# Patient Record
Sex: Female | Born: 1975 | Race: White | Hispanic: No | Marital: Married | Smoking: Current every day smoker
Health system: Southern US, Community
[De-identification: ages and names within clinical notes are randomized; demographics above are authoritative.]

## PROBLEM LIST (undated history)

## (undated) DIAGNOSIS — E079 Disorder of thyroid, unspecified: Secondary | ICD-10-CM

## (undated) DIAGNOSIS — F32A Depression, unspecified: Secondary | ICD-10-CM

## (undated) DIAGNOSIS — F1921 Other psychoactive substance dependence, in remission: Secondary | ICD-10-CM

## (undated) DIAGNOSIS — E785 Hyperlipidemia, unspecified: Secondary | ICD-10-CM

## (undated) DIAGNOSIS — F419 Anxiety disorder, unspecified: Secondary | ICD-10-CM

## (undated) DIAGNOSIS — F1020 Alcohol dependence, uncomplicated: Secondary | ICD-10-CM

## (undated) DIAGNOSIS — E063 Autoimmune thyroiditis: Secondary | ICD-10-CM

## (undated) DIAGNOSIS — K134 Granuloma and granuloma-like lesions of oral mucosa: Secondary | ICD-10-CM

## (undated) HISTORY — PX: SPINE SURGERY: SHX786

## (undated) HISTORY — PX: ABDOMINAL HYSTERECTOMY: SHX81

## (undated) HISTORY — DX: Hyperlipidemia, unspecified: E78.5

## (undated) HISTORY — DX: Anxiety disorder, unspecified: F41.9

## (undated) HISTORY — DX: Autoimmune thyroiditis: E06.3

## (undated) HISTORY — DX: Granuloma and granuloma-like lesions of oral mucosa: K13.4

## (undated) HISTORY — DX: Depression, unspecified: F32.A

## (undated) HISTORY — DX: Other psychoactive substance dependence, in remission: F19.21

## (undated) HISTORY — PX: BILATERAL CARPAL TUNNEL RELEASE: SHX6508

## (undated) HISTORY — DX: Disorder of thyroid, unspecified: E07.9

## (undated) HISTORY — DX: Alcohol dependence, uncomplicated: F10.20

---

## 2020-09-15 ENCOUNTER — Ambulatory Visit (HOSPITAL_BASED_OUTPATIENT_CLINIC_OR_DEPARTMENT_OTHER): Payer: Self-pay | Admitting: Family Medicine

## 2020-09-28 ENCOUNTER — Emergency Department
Admission: RE | Admit: 2020-09-28 | Discharge: 2020-09-28 | Discharge status: Against Medical Advice | Payer: 59 | Source: Ambulatory Visit

## 2020-09-28 ENCOUNTER — Other Ambulatory Visit: Payer: Self-pay

## 2020-10-07 ENCOUNTER — Encounter (HOSPITAL_BASED_OUTPATIENT_CLINIC_OR_DEPARTMENT_OTHER): Payer: Self-pay | Admitting: Nurse Practitioner

## 2020-10-07 ENCOUNTER — Other Ambulatory Visit: Payer: Self-pay

## 2020-10-07 ENCOUNTER — Ambulatory Visit (HOSPITAL_BASED_OUTPATIENT_CLINIC_OR_DEPARTMENT_OTHER): Payer: 59 | Admitting: Nurse Practitioner

## 2020-10-07 VITALS — BP 98/58 | HR 63 | Ht 63.0 in | Wt 120.4 lb

## 2020-10-07 DIAGNOSIS — F32A Depression, unspecified: Secondary | ICD-10-CM | POA: Diagnosis not present

## 2020-10-07 DIAGNOSIS — Z1231 Encounter for screening mammogram for malignant neoplasm of breast: Secondary | ICD-10-CM | POA: Diagnosis not present

## 2020-10-07 DIAGNOSIS — H01119 Allergic dermatitis of unspecified eye, unspecified eyelid: Secondary | ICD-10-CM | POA: Diagnosis not present

## 2020-10-07 DIAGNOSIS — F419 Anxiety disorder, unspecified: Secondary | ICD-10-CM | POA: Diagnosis not present

## 2020-10-07 DIAGNOSIS — Z7689 Persons encountering health services in other specified circumstances: Secondary | ICD-10-CM

## 2020-10-07 DIAGNOSIS — L4050 Arthropathic psoriasis, unspecified: Secondary | ICD-10-CM | POA: Insufficient documentation

## 2020-10-07 DIAGNOSIS — E063 Autoimmune thyroiditis: Secondary | ICD-10-CM

## 2020-10-07 DIAGNOSIS — E038 Other specified hypothyroidism: Secondary | ICD-10-CM | POA: Diagnosis not present

## 2020-10-07 MED ORDER — FLUOXETINE HCL 20 MG PO CAPS: 20.0000 mg | ORAL_CAPSULE | Freq: Every day | ORAL | 3 refills | Status: DC

## 2020-10-07 MED ORDER — LEVOTHYROXINE SODIUM 25 MCG PO TABS
25.0000 ug | ORAL_TABLET | Freq: Every day | ORAL | 3 refills | Status: DC
Start: 2020-10-07 — End: 2021-11-08

## 2020-10-07 MED ORDER — PIMECROLIMUS 1 % EX CREA: TOPICAL_CREAM | Freq: Two times a day (BID) | CUTANEOUS | 1 refills | Status: DC

## 2020-10-07 NOTE — Progress Notes (Signed)
Shawna Clamp, DNP, AGNP-c Primary Care Services ______________________________________________________________________________________________________________________________________________  HPI Chelsea Hill is a 45 y.o. female presenting to [facility] today to establish care.   Patient Care Team: Pcp, No as PCP - General Last CPE: Around May of last year Other providers seen: Dr. Elenore Rota, Internal Med at [facility] in [facility]  Concerns today: Refills on medication Out of prozac for 6 weeks  Been taking for 17 years- symptoms have returned since stopping medication of anxiety and depression Usually taking 20mg -40mg - most recent dose 20mg  per day Out of levothyroxine Started from endocrinology several years ago Symptoms are present Depression/anxiety, muscle pain, weight loss, itching, abdominal pain, constipation, weakness Rash on Eyelids Endorses sensitive eye lids with intermittent eczema-like rash when certain beauty products used Recent exacerbation with splitting of the outer layer of skin and serous drainage - about 2 weeks Using mupirocin, steroid cream, and Eucerin  Better but not completely resolved Mammogram Due for mammogram Autoimmune Concerns for autoimmune dysfunction Work-up by previous providers with endocrinology, internal medicine, and ortho- no findings Multiple symptoms present today Interested in looking into this further in future visits.    PHQ9 Today: Depression screen PHQ 2/9 10/07/2020  Decreased Interest 1  Down, Depressed, Hopeless 1  PHQ - 2 Score 2  Altered sleeping 0  Tired, decreased energy 3  Change in appetite 1  Feeling bad or failure about yourself  1  Trouble concentrating 1  Moving slowly or fidgety/restless 0  Suicidal thoughts 0  PHQ-9 Score 8  Difficult doing work/chores Very difficult   GAD7 Today: GAD 7 : Generalized Anxiety Score  10/07/2020  Nervous, Anxious, on Edge 2  Control/stop worrying 3  Worry too much - different things 3  Trouble relaxing 3  Restless 2  Easily annoyed or irritable 3  Afraid - awful might happen 2  Total GAD 7 Score 18  Anxiety Difficulty Very difficult    Health Maintenance Due  Topic Date Due   Pneumococcal Vaccine 65-71 Years old (1 - PCV) Never done   HIV Screening  Never done   Hepatitis C Screening  Never done   TETANUS/TDAP  Never done   PAP SMEAR-Modifier  Never done   COLONOSCOPY (Pts 45-63yrs Insurance coverage will need to be confirmed)  Never done   COVID-19 Vaccine (3 - Booster for Pfizer series) 06/08/2020     PMH History reviewed. No pertinent past medical history.  ROS All review of systems negative except what is listed in the HPI  PHYSICAL EXAM General appearance: alert, cooperative, appears stated age, and no distress, Resp: clear to auscultation bilaterally, Cardio: regular rate and rhythm, S1, S2 normal, no murmur, click, rub or gallop, and Extremities: extremities normal, atraumatic, no cyanosis or edema   ASSESSMENT AND PLAN Problem List Items Addressed This Visit   None Visit Diagnoses     Encounter to establish care    -  Primary   Hypothyroidism due to Hashimoto's thyroiditis       Relevant Medications   levothyroxine (SYNTHROID) 25 MCG tablet   Anxiety and depression       Relevant Medications   FLUoxetine (PROZAC) 20 MG capsule   Eyelid dermatitis, allergic/contact       Relevant Medications   pimecrolimus (ELIDEL) 1 % cream   Breast cancer screening by mammogram       Relevant Orders   MM 3D SCREEN BREAST BILATERAL     Plan:  Restart levothyroxine and prozac. Hold steroid cream and start using pimecrolimus to eye lids for 4 weeks or until symptoms resolve. F/U if symptoms worsen or fail to improve.   Will plan to get labs at CPE - monitor for improvement of symptoms with restart of medications.  Recommend smoking cessation- will  discuss this more at F/U visit  Education provided today during visit and on AVS for patient to review at home.  Diet and Exercise recommendations provided.  Current diagnoses and recommendations discussed. HM recommendations reviewed with recommendations.    Outpatient Encounter Medications as of 10/07/2020  Medication Sig   FLUoxetine (PROZAC) 20 MG capsule Take 1 capsule (20 mg total) by mouth daily.   levothyroxine (SYNTHROID) 25 MCG tablet Take 1 tablet (25 mcg total) by mouth daily before breakfast.   pimecrolimus (ELIDEL) 1 % cream Apply topically 2 (two) times daily.   [DISCONTINUED] doxycycline (PERIOSTAT) 20 MG tablet Take 20 mg by mouth 2 (two) times daily.   [DISCONTINUED] levothyroxine (SYNTHROID) 25 MCG tablet Take by mouth.   No facility-administered encounter medications on file as of 10/07/2020.    Return in about 4 weeks (around 11/04/2020) for CPE.  Time: 65 minutes, >50% spent counseling, care coordination, chart review, and documentation.   Tollie Eth, DNP, AGNP-c

## 2020-10-07 NOTE — Progress Notes (Deleted)
   Acute Office Visit  Subjective:    Patient ID: Thressa Sheller, female    DOB: 02-04-1976, 45 y.o.   MRN: [id number]  No chief complaint on file.   HPI Patient is in today for ***  No past medical history on file.  Outpatient Medications Prior to Visit  Medication Sig Dispense Refill   doxycycline (PERIOSTAT) 20 MG tablet Take 20 mg by mouth 2 (two) times daily.     levothyroxine (SYNTHROID) 25 MCG tablet Take by mouth.     No facility-administered medications prior to visit.    Not on File  Review of Systems All review of systems negative except what is listed in the HPI     Objective:    Physical Exam  There were no vitals taken for this visit. Wt Readings from Last 3 Encounters:  No data found for Wt    Health Maintenance Due  Topic Date Due   Pneumococcal Vaccine 66-44 Years old (1 - PCV) Never done   HIV Screening  Never done   Hepatitis C Screening  Never done   TETANUS/TDAP  Never done   PAP SMEAR-Modifier  Never done   COLONOSCOPY (Pts 45-73yr Insurance coverage will need to be confirmed)  Never done   COVID-19 Vaccine (3 - Booster for Pfizer series) 06/08/2020    There are no preventive care reminders to display for this patient.   No results found for: TSH No results found for: WBC, HGB, HCT, MCV, PLT No results found for: NA, K, CHLORIDE, CO2, GLUCOSE, BUN, CREATININE, BILITOT, ALKPHOS, AST, ALT, PROT, ALBUMIN, CALCIUM, ANIONGAP, EGFR, GFR No results found for: CHOL No results found for: HDL No results found for: LDLCALC No results found for: TRIG No results found for: CHOLHDL No results found for: HGBA1C     Assessment & Plan:   Problem List Items Addressed This Visit   None    No orders of the defined types were placed in this encounter.    SOrma Render NP

## 2020-10-07 NOTE — Patient Instructions (Addendum)
Recommendations from today's visit: We will plan for a full physical exam in the near future.  I have sent in refills on your medication today.   Information on diet, exercise, and health maintenance recommendations are listed below. This is information to help you be sure you are on track for optimal health and monitoring.   Please look over this and let us know if you have any questions or if you have completed any of the health maintenance outside of [place] so that we can be sure your records are up to date.  ___________________________________________________________  Thank you for choosing [place] at [facility] for your Primary Care needs. I am excited for the opportunity to partner with you to meet your health care goals. It was a pleasure meeting you today!  I am an Adult-Geriatric Nurse Practitioner with a background in caring for patients for more than 20 years. I received my Paediatric nurse in Nursing and my Doctor of Nursing Practice degrees at [employer]. I received additional fellowship training in primary care and sports medicine after receiving my doctorate degree. I provide primary care and sports medicine services to patients age 77 and older within this office. I am also a provider with the [facility] and the director of the APP Fellowship with [facility].  I am a [place] native, but have called the [place] area home for nearly 20 years and am proud to be a member of this community.   I am passionate about providing the best service to you through preventive medicine and supportive care. I consider you a part of the medical team and value your input. I work diligently to ensure that you are heard and your needs are met in a safe and effective manner. I want you to feel comfortable with me as your provider and want you to know that your health concerns are important to me.   For your information, our  office hours are Monday- Friday 8:00 AM - 5:00 PM At this time I am not in the office on Wednesdays.  If you have questions or concerns, please call our office at [phone] or send [name] a MyChart message and we will respond as quickly as possible.   For all urgent or time sensitive needs we ask that you please call the office to avoid delays. MyChart is not constantly monitored and replies may take up to 72 business hours.  MyChart Policy: MyChart allows for you to see your visit notes, after visit summary, provider recommendations, lab and tests results, make an appointment, request refills, and contact your provider or the office for non-urgent questions or concerns.  Providers are seeing patients during normal business hours and do not have built in time to review MyChart messages. We ask that you allow a minimum of 72 business hours for MyChart message responses.  Complex MyChart concerns may require a visit. Your provider may request you schedule a virtual or in person visit to ensure we are providing the best care possible. MyChart messages sent after 4:00 PM on Friday will not be received by the provider until Monday morning.    Lab and Test Results: You will receive your lab and test results on MyChart as soon as they are completed and results have been sent by the lab or testing facility. Due to this service, you will receive your results BEFORE your provider.  Please allow a minimum of 72 business hours for your  provider to receive and review lab and test results and contact you about.   Most lab and test result comments from the provider will be sent through [place]. Your provider may recommend changes to the plan of care, follow-up visits, repeat testing, ask questions, or request an office visit to discuss these results. You may reply directly to this message or call the office at [phone] to provide information for the provider or set up an appointment. In some instances, you will  be called with test results and recommendations. Please let us know if this is preferred and we will make note of this in your chart to provide this for you.    If you have not heard a response to your lab or test results in 72 business hours, please call the office to let us know.   After Hours: For all non-emergency after hours needs, please call the office at [phone] and select the option to reach the on-call provider service. On-call services are shared between multiple [place] offices and therefore it will not be possible to speak directly with your provider. On-call providers may provide medical advice and recommendations, but are unable to provide refills for maintenance medications.  For all emergency or urgent medical needs after normal business hours, we recommend that you seek care at the closest Urgent Care or Emergency Department to ensure appropriate treatment in a timely manner.  [facility] [place] has a 24 hour emergency room located on the ground floor for your convenience.    Please do not hesitate to reach out to [name] with concerns.   Thank you, again, for choosing me as your health care partner. I appreciate your trust and look forward to learning more about you.   Worthy Keeler, DNP, AGNP-c ___________________________________________________________  Health Maintenance Recommendations Screening Testing Mammogram Every 1 -2 years based on history and risk factors Starting at age 50 Pap Smear Ages 21-39 every 3 years Ages 75-65 every 5 years with HPV testing More frequent testing may be required based on results and history Colon Cancer Screening Every 1-10 years based on test performed, risk factors, and history Starting at age 4 Bone Density Screening Every 2-10 years based on history Starting at age 72 for women Recommendations for men differ based on medication usage, history, and risk factors AAA Screening One time ultrasound Men  17-1 years old who have every smoked Lung Cancer Screening Low Dose Lung CT every 12 months Age 36-80 years with a 30 pack-year smoking history who still smoke or who have quit within the last 15 years  Screening Labs Routine  Labs: Complete Blood Count (CBC), Complete Metabolic Panel (CMP), Cholesterol (Lipid Panel) Every 6-12 months based on history and medications May be recommended more frequently based on current conditions or previous results Hemoglobin A1c Lab Every 3-12 months based on history and previous results Starting at age 5 or earlier with diagnosis of diabetes, high cholesterol, BMI >26, and/or risk factors Frequent monitoring for patients with diabetes to ensure blood sugar control Thyroid Panel (TSH w/ T3 & T4) Every 6 months based on history, symptoms, and risk factors May be repeated more often if on medication HIV One time testing for all patients 23 and older May be repeated more frequently for patients with increased risk factors or exposure Hepatitis C One time testing for all patients 5 and older May be repeated more frequently for patients with increased risk factors or exposure Gonorrhea, Chlamydia Every 12 months for all sexually  active persons 13-24 years Additional monitoring may be recommended for those who are considered high risk or who have symptoms PSA Men 73-40 years old with risk factors Additional screening may be recommended from age 73-69 based on risk factors, symptoms, and history  Vaccine Recommendations Tetanus Booster All adults every 10 years Flu Vaccine All patients 6 months and older every year COVID Vaccine All patients 12 years and older Initial dosing with booster May recommend additional booster based on age and health history HPV Vaccine 2 doses all patients age 20-26 Dosing may be considered for patients over 26 Shingles Vaccine (Shingrix) 2 doses all adults 8 years and older Pneumonia (Pneumovax 23) All adults 35  years and older May recommend earlier dosing based on health history Pneumonia (Prevnar 17) All adults 5 years and older Dosed 1 year after Pneumovax 23  Additional Screening, Testing, and Vaccinations may be recommended on an individualized basis based on family history, health history, risk factors, and/or exposure.  __________________________________________________________  Diet Recommendations for All Patients  I recommend that all patients maintain a diet low in saturated fats, carbohydrates, and cholesterol. While this can be challenging at first, it is not impossible and small changes can make big differences.  Things to try: Decreasing the amount of soda, sweet tea, and/or juice to one or less per day and replace with water While water is always the first choice, if you do not like water you may consider adding a water additive without sugar to improve the taste other sugar free drinks Replace potatoes with a brightly colored vegetable at dinner Use healthy oils, such as canola oil or olive oil, instead of butter or hard margarine Limit your bread intake to two pieces or less a day Replace regular pasta with low carb pasta options Bake, broil, or grill foods instead of frying Monitor portion sizes  Eat smaller, more frequent meals throughout the day instead of large meals  An important thing to remember is, if you love foods that are not great for your health, you don't have to give them up completely. Instead, allow these foods to be a reward when you have done well. Allowing yourself to still have special treats every once in a while is a nice way to tell yourself thank you for working hard to keep yourself healthy.   Also remember that every day is a new day. If you have a bad day and "fall off the wagon", you can still climb right back up and keep moving along on your journey!  We have resources available to help you!  Some websites that may be helpful  include: www.http://carter.biz/  Www.VeryWellFit.com _____________________________________________________________  Activity Recommendations for All Patients  I recommend that all adults get at least 20 minutes of moderate physical activity that elevates your heart rate at least 5 days out of the week.  Some examples include: Walking or jogging at a pace that allows you to carry on a conversation Cycling (stationary bike or outdoors) Water aerobics Yoga Weight lifting Dancing If physical limitations prevent you from putting stress on your joints, exercise in a pool or seated in a chair are excellent options.  Do determine your MAXIMUM heart rate for activity: YOUR AGE - 220 = MAX HeartRate   Remember! Do not push yourself too hard.  Start slowly and build up your pace, speed, weight, time in exercise, etc.  Allow your body to rest between exercise and get good sleep. You will need more water than normal when  you are exerting yourself. Do not wait until you are thirsty to drink. Drink with a purpose of getting in at least 8, 8 ounce glasses of water a day plus more depending on how much you exercise and sweat.    If you begin to develop dizziness, chest pain, abdominal pain, jaw pain, shortness of breath, headache, vision changes, lightheadedness, or other concerning symptoms, stop the activity and allow your body to rest. If your symptoms are severe, seek emergency evaluation immediately. If your symptoms are concerning, but not severe, please let us know so that we can recommend further evaluation.   ________________________________________________________________  Atopic Dermatitis Atopic dermatitis is a skin disorder that causes inflammation of the skin. It is marked by a red rash and itchy, dry, scaly skin. It is the most common type of eczema. Eczema is a group of skin conditions that cause the skin to become rough and swollen. This condition is generally worse during the cooler winter  months and often improves during the warm summer months. Atopic dermatitis usually starts showing signs in infancy and can last through adulthood. This condition cannot be passed from one person to another (is not contagious). Atopic dermatitis may not always be present, but when it is, it is called a flare-up. What are the causes? The exact cause of this condition is not known. Flare-ups may be triggered by: Coming in contact with something that you are sensitive or allergic to (allergen). Stress. Certain foods. Extremely hot or cold weather. Harsh chemicals and soaps. Dry air. Chlorine. What increases the risk? This condition is more likely to develop in people who have a personal or family history of: Eczema. Allergies. Asthma. Hay fever. What are the signs or symptoms? Symptoms of this condition include: Dry, scaly skin. Red, itchy rash. Itchiness, which can be severe. This may occur before the skin rash. This can make sleeping difficult. Skin thickening and cracking that can occur over time.   How is this diagnosed? This condition is diagnosed based on: Your symptoms. Your medical history. A physical exam. How is this treated? There is no cure for this condition, but symptoms can usually be controlled. Treatment focuses on: Controlling the itchiness and scratching. You may be given medicines, such as antihistamines or steroid creams. Limiting exposure to allergens. Recognizing situations that cause stress and developing a plan to manage stress. If your atopic dermatitis does not get better with medicines, or if it is all over your body (widespread), a treatment using a specific type of light (phototherapy) may be used. Follow these instructions at home: Skin care Keep your skin well moisturized. Doing this seals in moisture and helps to prevent dryness. Use unscented lotions that have petroleum in them. Avoid lotions that contain alcohol or water. They can dry the  skin. Keep baths or showers short (less than 5 minutes) in warm water. Do not use hot water. Use mild, unscented cleansers for bathing. Avoid soap and bubble bath. Apply a moisturizer to your skin right after a bath or shower. Do not apply anything to your skin without checking with your health care provider.   General instructions Take or apply over-the-counter and prescription medicines only as told by your health care provider. Dress in clothes made of cotton or cotton blends. Dress lightly because heat increases itchiness. When washing your clothes, rinse your clothes twice so all of the soap is removed. Avoid any triggers that can cause a flare-up. Keep your fingernails cut short. Avoid scratching. Scratching makes the  rash and itchiness worse. A break in the skin from scratching could result in a skin infection (impetigo). Do not be around people who have cold sores or fever blisters. If you get the infection, it may cause your atopic dermatitis to worsen. Keep all follow-up visits. This is important. Contact a health care provider if: Your itchiness interferes with sleep. Your rash gets worse or is not better within one week of starting treatment. You have a fever. You have a rash flare-up after having contact with someone who has cold sores or fever blisters. Get help right away if: You develop pus or soft yellow scabs in the rash area. Summary Atopic dermatitis causes a red rash and itchy, dry, scaly skin. Treatment focuses on controlling the itchiness and scratching, limiting exposure to things that you are sensitive or allergic to (allergens), recognizing situations that cause stress, and developing a plan to manage stress. Keep your skin well moisturized. Keep baths or showers shorter than 5 minutes and use warm water. Do not use hot water. This information is not intended to replace advice given to you by your health care provider. Make sure you discuss any questions you have  with your health care provider. Document Revised: 01/26/2020 Document Reviewed: 01/26/2020 Elsevier Patient Education  2021 [employer].

## 2020-10-18 ENCOUNTER — Ambulatory Visit (HOSPITAL_BASED_OUTPATIENT_CLINIC_OR_DEPARTMENT_OTHER)
Admission: RE | Admit: 2020-10-18 | Discharge: 2020-10-18 | Disposition: A | Discharge status: Home / Self Care | Payer: 59 | Source: Ambulatory Visit | Attending: Nurse Practitioner | Admitting: Nurse Practitioner

## 2020-10-18 ENCOUNTER — Other Ambulatory Visit: Payer: Self-pay

## 2020-10-18 DIAGNOSIS — Z1231 Encounter for screening mammogram for malignant neoplasm of breast: Secondary | ICD-10-CM | POA: Diagnosis not present

## 2020-11-10 ENCOUNTER — Other Ambulatory Visit: Payer: Self-pay | Admitting: Nurse Practitioner

## 2020-11-10 DIAGNOSIS — R928 Other abnormal and inconclusive findings on diagnostic imaging of breast: Secondary | ICD-10-CM

## 2020-11-19 ENCOUNTER — Encounter (HOSPITAL_BASED_OUTPATIENT_CLINIC_OR_DEPARTMENT_OTHER): Payer: Self-pay | Admitting: *Deleted

## 2020-11-19 ENCOUNTER — Other Ambulatory Visit: Payer: Self-pay

## 2020-11-19 ENCOUNTER — Emergency Department (HOSPITAL_BASED_OUTPATIENT_CLINIC_OR_DEPARTMENT_OTHER)
Admission: EM | Admit: 2020-11-19 | Discharge: 2020-11-19 | Disposition: A | Discharge status: Home / Self Care | Payer: 59 | Attending: Emergency Medicine | Admitting: Emergency Medicine

## 2020-11-19 ENCOUNTER — Emergency Department (HOSPITAL_BASED_OUTPATIENT_CLINIC_OR_DEPARTMENT_OTHER): Payer: 59

## 2020-11-19 DIAGNOSIS — E038 Other specified hypothyroidism: Secondary | ICD-10-CM | POA: Diagnosis not present

## 2020-11-19 DIAGNOSIS — Z79899 Other long term (current) drug therapy: Secondary | ICD-10-CM | POA: Diagnosis not present

## 2020-11-19 DIAGNOSIS — E860 Dehydration: Secondary | ICD-10-CM | POA: Diagnosis not present

## 2020-11-19 DIAGNOSIS — R61 Generalized hyperhidrosis: Secondary | ICD-10-CM | POA: Insufficient documentation

## 2020-11-19 DIAGNOSIS — R002 Palpitations: Secondary | ICD-10-CM | POA: Diagnosis not present

## 2020-11-19 DIAGNOSIS — R079 Chest pain, unspecified: Secondary | ICD-10-CM | POA: Diagnosis not present

## 2020-11-19 DIAGNOSIS — F17210 Nicotine dependence, cigarettes, uncomplicated: Secondary | ICD-10-CM | POA: Insufficient documentation

## 2020-11-19 DIAGNOSIS — E876 Hypokalemia: Secondary | ICD-10-CM

## 2020-11-19 LAB — CBC WITH DIFFERENTIAL/PLATELET
Abs Immature Granulocytes: 0.03 10*3/uL (ref 0.00–0.07)
Basophils Absolute: 0.1 10*3/uL (ref 0.0–0.1)
Basophils Relative: 1 %
Eosinophils Absolute: 0.3 10*3/uL (ref 0.0–0.5)
Eosinophils Relative: 3 %
HCT: 37.4 % (ref 36.0–46.0)
Hemoglobin: 12.9 g/dL (ref 12.0–15.0)
Immature Granulocytes: 0 %
Lymphocytes Relative: 43 %
Lymphs Abs: 5.1 10*3/uL — ABNORMAL HIGH (ref 0.7–4.0)
MCH: 31.5 pg (ref 26.0–34.0)
MCHC: 34.5 g/dL (ref 30.0–36.0)
MCV: 91.2 fL (ref 80.0–100.0)
Monocytes Absolute: 1.1 10*3/uL — ABNORMAL HIGH (ref 0.1–1.0)
Monocytes Relative: 9 %
Neutro Abs: 5.1 10*3/uL (ref 1.7–7.7)
Neutrophils Relative %: 44 %
Platelets: 291 10*3/uL (ref 150–400)
RBC: 4.1 MIL/uL (ref 3.87–5.11)
RDW: 12.8 % (ref 11.5–15.5)
WBC: 11.7 10*3/uL — ABNORMAL HIGH (ref 4.0–10.5)
nRBC: 0 % (ref 0.0–0.2)

## 2020-11-19 LAB — COMPREHENSIVE METABOLIC PANEL
ALT: 13 U/L (ref 0–44)
AST: 13 U/L — ABNORMAL LOW (ref 15–41)
Albumin: 3.7 g/dL (ref 3.5–5.0)
Alkaline Phosphatase: 45 U/L (ref 38–126)
Anion gap: 7 (ref 5–15)
BUN: 15 mg/dL (ref 6–20)
CO2: 26 mmol/L (ref 22–32)
Calcium: 8.8 mg/dL — ABNORMAL LOW (ref 8.9–10.3)
Chloride: 103 mmol/L (ref 98–111)
Creatinine, Ser: 0.63 mg/dL (ref 0.44–1.00)
GFR, Estimated: >60 mL/min (ref >60)
Glucose, Bld: 99 mg/dL (ref 70–99)
Potassium: 3.3 mmol/L — ABNORMAL LOW (ref 3.5–5.1)
Sodium: 136 mmol/L (ref 135–145)
Total Bilirubin: 0.2 mg/dL — ABNORMAL LOW (ref 0.3–1.2)
Total Protein: 6.4 g/dL — ABNORMAL LOW (ref 6.5–8.1)

## 2020-11-19 LAB — MAGNESIUM: Magnesium: 1.9 mg/dL (ref 1.7–2.4)

## 2020-11-19 LAB — CK: Total CK: 40 U/L (ref 38–234)

## 2020-11-19 LAB — TROPONIN I (HIGH SENSITIVITY): Troponin I (High Sensitivity): 3 ng/L (ref <18)

## 2020-11-19 MED ORDER — SODIUM CHLORIDE 0.9 % IV BOLUS
1000.0000 mL | Freq: Once | INTRAVENOUS | Status: AC
  Administered 2020-11-19: 1000 mL via INTRAVENOUS

## 2020-11-19 MED ORDER — FAMOTIDINE IN NACL 20-0.9 MG/50ML-% IV SOLN
20.0000 mg | Freq: Once | INTRAVENOUS | Status: AC
  Administered 2020-11-19: 20 mg via INTRAVENOUS
  Filled 2020-11-19: qty 50

## 2020-11-19 MED ORDER — POTASSIUM CHLORIDE CRYS ER 20 MEQ PO TBCR
40.0000 meq | EXTENDED_RELEASE_TABLET | Freq: Once | ORAL | Status: AC
  Administered 2020-11-19: 40 meq via ORAL
  Filled 2020-11-19 (×2): qty 2

## 2020-11-19 NOTE — ED Provider Notes (Signed)
[facility] EMERGENCY DEPARTMENT Provider Note   CSN: [id number] Arrival date & time: 11/19/20  2040     History Chief Complaint  Patient presents with   Palpitations    Chelsea Hill is a 45 y.o. female history of alcohol use, depression and anxiety here presenting with palpitations.  Patient states that she she went out in the heat yesterday and was wearing a coat.  She states that she may have gotten heat exhaustion and felt very dehydrated.  She states that she has persistent palpitations since yesterday.  She states that she also had night sweats yesterday.  She states that she is finishing her Medrol Dosepak and is on the last day today.  Denies any fever or chills or cough  The history is provided by the patient.      Past Medical History:  Diagnosis Date   Alcohol addiction (HCC)    Anxiety    Depression    Drug addiction in remission [facility])    Hyperlipidemia    Thyroid disease     Patient Active Problem List   Diagnosis Date Noted   Hypothyroidism due to Hashimoto's thyroiditis 10/07/2020   Anxiety and depression 10/07/2020   Eyelid dermatitis, allergic/contact 10/07/2020    Past Surgical History:  Procedure Laterality Date   ABDOMINAL HYSTERECTOMY     SPINE SURGERY       OB History   No obstetric history on file.     Family History  Problem Relation Age of Onset   Hypertension Father    Heart disease Father    Breast cancer Maternal Aunt     Social History   Tobacco Use   Smoking status: Every Day    Packs/day: 1.00    Years: 30.00    Pack years: 30.00    Types: Cigarettes   Smokeless tobacco: Never   Tobacco comments:    Patient reports that she would like to quit smoking if she can get her autoimmune symptoms under control  Vaping Use   Vaping Use: Never used  Substance Use Topics   Alcohol use: Not Currently   Drug use: Not Currently    Home Medications Prior to Admission medications   Medication Sig Start Date End  Date Taking? Authorizing Provider  FLUoxetine (PROZAC) 20 MG capsule Take 1 capsule (20 mg total) by mouth daily. 10/07/20  Yes Early, Sung Amabile, NP  levothyroxine (SYNTHROID) 25 MCG tablet Take 1 tablet (25 mcg total) by mouth daily before breakfast. 10/07/20  Yes Early, Sung Amabile, NP  pimecrolimus (ELIDEL) 1 % cream Apply topically 2 (two) times daily. 10/07/20   Tollie Eth, NP    Allergies    Venlafaxine  Review of Systems   Review of Systems  Cardiovascular:  Positive for palpitations.  All other systems reviewed and are negative.  Physical Exam Updated Vital Signs BP 119/88 (BP Location: Right Arm)   Pulse 68   Temp 98.5 F (36.9 C) (Oral)   Resp 15   Ht 5\' 3"  (1.6 m)   Wt 51 kg   SpO2 99%   BMI 19.93 kg/m   Physical Exam Vitals and nursing note reviewed.  Constitutional:      Appearance: Normal appearance.  HENT:     Head: Normocephalic.     Nose: Nose normal.     Mouth/Throat:     Mouth: Mucous membranes are dry.     Comments: Mucous membranes slightly dry Eyes:     Extraocular Movements: Extraocular movements  intact.     Pupils: Pupils are equal, round, and reactive to light.  Cardiovascular:     Rate and Rhythm: Normal rate and regular rhythm.     Pulses: Normal pulses.     Heart sounds: Normal heart sounds.  Pulmonary:     Effort: Pulmonary effort is normal.     Breath sounds: Normal breath sounds.  Abdominal:     General: Abdomen is flat.     Palpations: Abdomen is soft.  Musculoskeletal:        General: Normal range of motion.     Cervical back: Normal range of motion and neck supple.  Skin:    General: Skin is warm.     Capillary Refill: Capillary refill takes less than 2 seconds.  Neurological:     General: No focal deficit present.     Mental Status: She is oriented to person, place, and time.  Psychiatric:        Mood and Affect: Mood normal.        Behavior: Behavior normal.    ED Results / Procedures / Treatments   Labs (all labs ordered are  listed, but only abnormal results are displayed) Labs Reviewed  CBC WITH DIFFERENTIAL/PLATELET - Abnormal; Notable for the following components:      Result Value   WBC 11.7 (*)    Lymphs Abs 5.1 (*)    Monocytes Absolute 1.1 (*)    All other components within normal limits  COMPREHENSIVE METABOLIC PANEL - Abnormal; Notable for the following components:   Potassium 3.3 (*)    Calcium 8.8 (*)    Total Protein 6.4 (*)    AST 13 (*)    Total Bilirubin 0.2 (*)    All other components within normal limits  MAGNESIUM  CK  TROPONIN I (HIGH SENSITIVITY)    EKG EKG Interpretation  Date/Time:  Friday November 19 2020 20:51:21 EDT Ventricular Rate:  69 PR Interval:  174 QRS Duration: 72 QT Interval:  360 QTC Calculation: 385 R Axis:   74 Text Interpretation: Sinus rhythm with Premature atrial complexes Otherwise normal ECG No previous ECGs available Confirmed by Richardean Canal ([id number]) on 11/19/2020 9:43:39 PM  Radiology DG Chest Port 1 View  Result Date: 11/19/2020 CLINICAL DATA:  45 year old female with chest pain. EXAM: PORTABLE CHEST 1 VIEW COMPARISON:  None. FINDINGS: The lungs are clear. There is no pleural effusion pneumothorax. The cardiac silhouette is within limits. No acute osseous pathology. Lower cervical ACDF. IMPRESSION: No active disease. Electronically Signed   By: Elgie Collard M.D.   On: 11/19/2020 22:36    Procedures Procedures   Medications Ordered in ED Medications  sodium chloride 0.9 % bolus 1,000 mL (1,000 mLs Intravenous New Bag/Given 11/19/20 2201)  famotidine (PEPCID) IVPB 20 mg premix (0 mg Intravenous Stopped 11/19/20 2228)  potassium chloride SA (KLOR-CON) CR tablet 40 mEq (40 mEq Oral Given 11/19/20 2241)    ED Course  I have reviewed the triage vital signs and the nursing notes.  Pertinent labs & imaging results that were available during my care of the patient were reviewed by me and considered in my medical decision making (see chart for details).     MDM Rules/Calculators/A&P                          [name] is a 45 y.o. female here with palpitations. Likely heat exhaustion vs side effect of steroids. Will check chemistry and  CK level. Will hydrate and reassess.    11:10 PM Potassium slightly low at 3.3 and was supplemented.  Patient was given IV fluids and felt better.  Palpitations has resolved.  Told her to stay hydrated and eat foods high with potassium.  Stable for discharge  Final Clinical Impression(s) / ED Diagnoses Final diagnoses:  None    Rx / DC Orders ED Discharge Orders     None        Charlynne Pander, MD 11/19/20 2311

## 2020-11-19 NOTE — ED Triage Notes (Signed)
Heart palpitations x 2 days. States it is making her feel anxious. Night sweats. She is taking a prednisone dose pack.

## 2020-11-19 NOTE — Discharge Instructions (Addendum)
Stay hydrated.  Eat food high in potassium and avoid being in the heat   See your doctor   Return to ER if you have worse chest pain, palpitations, shortness of breath.

## 2020-11-25 ENCOUNTER — Ambulatory Visit (HOSPITAL_BASED_OUTPATIENT_CLINIC_OR_DEPARTMENT_OTHER): Payer: 59 | Admitting: Nurse Practitioner

## 2020-11-25 ENCOUNTER — Other Ambulatory Visit: Payer: Self-pay

## 2020-11-25 ENCOUNTER — Encounter (HOSPITAL_BASED_OUTPATIENT_CLINIC_OR_DEPARTMENT_OTHER): Payer: Self-pay | Admitting: Nurse Practitioner

## 2020-11-25 VITALS — BP 102/71 | HR 68 | Ht 63.0 in | Wt 113.8 lb

## 2020-11-25 DIAGNOSIS — Z139 Encounter for screening, unspecified: Secondary | ICD-10-CM

## 2020-11-25 DIAGNOSIS — M2559 Pain in other specified joint: Secondary | ICD-10-CM

## 2020-11-25 DIAGNOSIS — R21 Rash and other nonspecific skin eruption: Secondary | ICD-10-CM | POA: Insufficient documentation

## 2020-11-25 DIAGNOSIS — M2550 Pain in unspecified joint: Secondary | ICD-10-CM | POA: Insufficient documentation

## 2020-11-25 DIAGNOSIS — K134 Granuloma and granuloma-like lesions of oral mucosa: Secondary | ICD-10-CM

## 2020-11-25 DIAGNOSIS — F17209 Nicotine dependence, unspecified, with unspecified nicotine-induced disorders: Secondary | ICD-10-CM | POA: Diagnosis not present

## 2020-11-25 MED ORDER — PREDNISONE 10 MG PO TABS: ORAL_TABLET | ORAL | 0 refills | Status: DC

## 2020-11-25 NOTE — Assessment & Plan Note (Addendum)
>>  ASSESSMENT AND PLAN FOR PSORIATIC ARTHRITIS (HCC) WRITTEN ON 07/10/2023  7:41 AM BY Monic Engelmann E, NP   >>ASSESSMENT AND PLAN FOR PSORIATIC ARTHRITIS (HCC) WRITTEN ON 07/10/2023  7:40 AM BY Joscelynn Brutus E, NP   >>ASSESSMENT AND PLAN FOR OROFACIAL GRANULOMATOSIS WRITTEN ON 11/25/2020 12:11 PM BY Angelis Gates E, NP  Unclear if oral dryness is associated with external manifestation or if this is a possible autoimmune or exanthemous exacerbation of chronic condition.  Responds well to oral steroid long taper.  Will test today for autoimmune component and start on long term taper of prednisone.  Consider low dose daily prednisone if symptoms rebound.  Referral to rheumatology for further evaluation and recommendations.  F/U in 4 weeks   >>ASSESSMENT AND PLAN FOR RASH WRITTEN ON 11/25/2020 12:08 PM BY Maeley Matton E, NP  3-81mm scattered erythematous papules bilaterally on flank. No further involvement present at this time, however, historically this has been an issue.  Will test today for autoimmune conditions that could be present given her prolonged symptoms and response to steroid treatment in the past.  Consider possible long term 5mg  prednisone if this is effective. Education provided today that we may not be able to determine cause, but will proceed with testing. Will attempt to get records for review.  F/U in 4 weeks   >>ASSESSMENT AND PLAN FOR JOINT PAIN WRITTEN ON 11/25/2020 12:11 PM BY Takina Busser E, NP  Transient bilateral joint pain chronically with skin symptoms that may indicate autoimmune component.  At this time, it is unclear what could be causing her symptoms.  She has been seen and worked up extensively for this, however, I do not have access to those records.  Given the presentation of symptoms today, will repeat testing.  Recommend witting a timeline of events to help piece through this chronic issue.  Will start steroid taper.  Referral to rheumatology for further  recommendations.  F/U in 4 weeks.

## 2020-11-25 NOTE — Assessment & Plan Note (Addendum)
Transient bilateral joint pain chronically with skin symptoms that may indicate autoimmune component.  At this time, it is unclear what could be causing her symptoms.  She has been seen and worked up extensively for this, however, I do not have access to those records.  Given the presentation of symptoms today, will repeat testing.  Recommend witting a timeline of events to help piece through this chronic issue.  Will start steroid taper.  Referral to rheumatology for further recommendations.  F/U in 4 weeks.

## 2020-11-25 NOTE — Assessment & Plan Note (Signed)
Current everyday smoker.  Patient voices that she would like to quit, however, exacerbation of condition presents when smoking is stopped. Will work to find what could be causing her symptoms. If low dose daily steroid is effective, this may be an option for patient to trial cessation.

## 2020-11-25 NOTE — Progress Notes (Signed)
Acute Office Visit  Subjective:    Patient ID: Chelsea Hill, female    DOB: [birth year], 45 y.o.   MRN: [id number]  Chief Complaint  Patient presents with   Follow-up    Patient states she is not sure of what is going on, but generally feels something is not right.  Skin condition on her eyelids, mouth and rash on her trunk area.    HPI Patient is in today for concerns for autoimmune dysfunction. She reports that she has been tested in the past by different providers with no specific diagnosis of results. She has seen rheumatology and dermatology and has had evaluation at the [facility].  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 She also has hashimoto's. She reports that her symptoms started/worsened when she quit smoking to the point that she started smoking again after 2 years quit. She reports when she started smoking again, her symptoms improved.  For about the past year she has been experiencing:  Intermittent papular rash- turns to dry flaky patches over several day Pruritic once dried/patchy Scattered on trunk and upper body One point was all over body Intermittent hair loss Dermatitis on eyelids Dermatitis around mouth Constant dry, chapped feeling of lips Feels like inside the lips is swollen Dry eyes Joint pain- "tendon" specific Lasts in one area a few weeks to months Moves to different joints Most often bilateral  She reports in the past she had success with a long term steroid taper, which kept the symptoms away for several months. She tells me she thinks she has been tested for "everything", but not sure of any specific diagnosis. She did have the rash biopsied in the past, but cannot remember the marker that it was positive for.   Today she has ecxanthemous rash on her eye lids and around her mouth. She also has a papular scattered rash on her trunk.  She has left hip "tendonitis" pain, her  mouth is sore and dry. She stopped a steroid taper on Sunday after having itching in her eyes, lips, neck, and vagina- this cleared with the steroid, but has returned.  She has no known fevers. She reports extreme frustration with her symptoms and tells me that she is afraid to go anywhere. She reports that it has taken a toll on her emotionally and her self-esteem has been affected.    Past Medical History:  Diagnosis Date   Alcohol addiction ([facility])    Anxiety    Depression    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[place])    Hyperlipidemia    Thyroid disease     Past Surgical History:  Procedure Laterality Date   ABDOMINAL HYSTERECTOMY     SPINE SURGERY      Family History  Problem Relation Age of Onset   Hypertension Father    Heart disease Father    Breast cancer Maternal Aunt     Social History   Socioeconomic History   Marital status: Married    Spouse name: Not on file   Number of children: Not on file   Years of education: Not on file   Highest education level: Not on file  Occupational History   Not on file  Tobacco Use   Smoking status: Every Day    Packs/day: 1.00    Years: 30.00    Pack years: 30.00    Types: Cigarettes   Smokeless tobacco: Never   Tobacco comments:  Patient reports that she would like to quit smoking if she can get her autoimmune symptoms under control  Vaping Use   Vaping Use: Never used  Substance and Sexual Activity   Alcohol use: Not Currently   Drug use: Not Currently   Sexual activity: Yes    Birth control/protection: Surgical  Other Topics Concern   Not on file  Social History Narrative   Not on file   Social Determinants of Health   Financial Resource Strain: Not on file  Food Insecurity: Not on file  Transportation Needs: Not on file  Physical Activity: Not on file  Stress: Not on file  Social Connections: Not on file  Intimate Partner Violence: Not on file    Outpatient Medications Prior to Visit  Medication Sig  Dispense Refill   FLUoxetine (PROZAC) 20 MG capsule Take 1 capsule (20 mg total) by mouth daily. 90 capsule 3   levothyroxine (SYNTHROID) 25 MCG tablet Take 1 tablet (25 mcg total) by mouth daily before breakfast. 90 tablet 3   propranolol (INDERAL) 10 MG tablet Take 10 mg by mouth 3 (three) times daily.     pimecrolimus (ELIDEL) 1 % cream Apply topically 2 (two) times daily. 60 g 1   No facility-administered medications prior to visit.    Allergies  Allergen Reactions   Venlafaxine     Review of Systems     Objective:    Physical Exam Vitals and nursing note reviewed.  Constitutional:      Appearance: Normal appearance. She is normal weight.  HENT:     Head: Normocephalic and atraumatic.     Comments: Scaly patches over pink based noted to eyelids with no drainage.  Erythematous macular discoloration present on bridge of nose and cheeks bilaterally.     Mouth/Throat:     Pharynx: Oropharynx is clear.  Eyes:     General: Vision grossly intact. Gaze aligned appropriately. No visual field deficit.    Extraocular Movements: Extraocular movements intact.     Conjunctiva/sclera: Conjunctivae normal.     Pupils: Pupils are equal, round, and reactive to light.  Cardiovascular:     Rate and Rhythm: Normal rate and regular rhythm.     Pulses: Normal pulses.     Heart sounds: Normal heart sounds.  Pulmonary:     Effort: Pulmonary effort is normal.     Breath sounds: Normal breath sounds.  Abdominal:     General: Abdomen is flat.     Palpations: Abdomen is soft.  Musculoskeletal:        General: Tenderness present.     Cervical back: Full passive range of motion without pain, normal range of motion and neck supple.     Right lower leg: No edema.     Left lower leg: No edema.       Legs:  Skin:    General: Skin is warm and dry.     Capillary Refill: Capillary refill takes less than 2 seconds.     Findings: Rash present. Rash is papular.     Comments: Scattered distinct  3-58m erythematous non-pustular papules located on left and right flank. No pruritis, drainage, or swelling present.   Neurological:     General: No focal deficit present.     Mental Status: She is alert and oriented to person, place, and time.     Cranial Nerves: No cranial nerve deficit.     Sensory: No sensory deficit.     Motor: No weakness.  Coordination: Coordination normal.     Gait: Gait normal.  Psychiatric:        Attention and Perception: Attention normal.        Mood and Affect: Mood is anxious.        Speech: Speech is rapid and pressured.        Behavior: Behavior is cooperative.        Thought Content: Thought content normal.        Cognition and Memory: Cognition normal.        Judgment: Judgment normal.    BP 102/71   Pulse 68   Ht _0  (1.6 m)   Wt 113 lb 12.8 oz (51.6 kg)   SpO2 91%   BMI 20.16 kg/m  Wt Readings from Last 3 Encounters:  11/25/20 113 lb 12.8 oz (51.6 kg)  11/19/20 112 lb 8 oz (51 kg)  10/07/20 120 lb 6.4 oz (54.6 kg)    Health Maintenance Due  Topic Date Due   Pneumococcal Vaccine 70-72 Years old (1 - PCV) Never done   HIV Screening  Never done   Hepatitis C Screening  Never done   TETANUS/TDAP  Never done   PAP SMEAR-Modifier  Never done   COLONOSCOPY (Pts 45-85yr Insurance coverage will need to be confirmed)  Never done   COVID-19 Vaccine (3 - Booster for PFreedomseries) 06/08/2020    There are no preventive care reminders to display for this patient.   No results found for: TSH Lab Results  Component Value Date   WBC 11.7 (H) 11/19/2020   HGB 12.9 11/19/2020   HCT 37.4 11/19/2020   MCV 91.2 11/19/2020   PLT 291 11/19/2020   Lab Results  Component Value Date   NA 136 11/19/2020   K 3.3 (L) 11/19/2020   CO2 26 11/19/2020   GLUCOSE 99 11/19/2020   BUN 15 11/19/2020   CREATININE 0.63 11/19/2020   BILITOT 0.2 (L) 11/19/2020   ALKPHOS 45 11/19/2020   AST 13 (L) 11/19/2020   ALT 13 11/19/2020   PROT 6.4 (L)  11/19/2020   ALBUMIN 3.7 11/19/2020   CALCIUM 8.8 (L) 11/19/2020   ANIONGAP 7 11/19/2020   No results found for: CHOL No results found for: HDL No results found for: LDLCALC No results found for: TRIG No results found for: CHOLHDL No results found for: HGBA1C     Assessment & Plan:   Problem List Items Addressed This Visit     Joint pain - Primary    Transient bilateral joint pain chronically with skin symptoms that may indicate autoimmune component.  At this time, it is unclear what could be causing her symptoms.  She has been seen and worked up extensively for this, however, I do not have access to those records.  Given the presentation of symptoms today, will repeat testing.  Recommend witting a timeline of events to help piece through this chronic issue.  Will start steroid taper.  Referral to rheumatology for further recommendations.  F/U in 4 weeks.        Relevant Medications   predniSONE (DELTASONE) 10 MG tablet   Other Relevant Orders   Sed Rate (ESR)   C-reactive protein   Systemic Lupus Profile A   Systemic Lupus Profile B   SLE Profile C   MyoMarker 3 Plus Profile (RDL)   Rash in adult    3-466mscattered erythematous papules bilaterally on flank. No further involvement present at this time, however, historically this has been an  issue.  Will test today for autoimmune conditions that could be present given her prolonged symptoms and response to steroid treatment in the past.  Consider possible long term 84m prednisone if this is effective. Education provided today that we may not be able to determine cause, but will proceed with testing. Will attempt to get records for review.  F/U in 4 weeks       Relevant Medications   predniSONE (DELTASONE) 10 MG tablet   Other Relevant Orders   Sed Rate (ESR)   C-reactive protein   Systemic Lupus Profile A   Systemic Lupus Profile B   SLE Profile C   MyoMarker 3 Plus Profile (RDL)   Orofacial granulomatosis     Unclear if oral dryness is associated with external manifestation or if this is a possible autoimmune or exanthemous exacerbation of chronic condition.  Responds well to oral steroid long taper.  Will test today for autoimmune component and start on long term taper of prednisone.  Consider low dose daily prednisone if symptoms rebound.  Referral to rheumatology for further evaluation and recommendations.  F/U in 4 weeks       Relevant Medications   predniSONE (DELTASONE) 10 MG tablet   Other Relevant Orders   Sed Rate (ESR)   C-reactive protein   Systemic Lupus Profile A   Systemic Lupus Profile B   SLE Profile C   MyoMarker 3 Plus Profile (RDL)   Tobacco use disorder, continuous    Current everyday smoker.  Patient voices that she would like to quit, however, exacerbation of condition presents when smoking is stopped. Will work to find what could be causing her symptoms. If low dose daily steroid is effective, this may be an option for patient to trial cessation.        Other Visit Diagnoses     Screening for condition       Relevant Orders   Sed Rate (ESR)   C-reactive protein   Systemic Lupus Profile A   Systemic Lupus Profile B   SLE Profile C   MyoMarker 3 Plus Profile (RDL)        Meds ordered this encounter  Medications   predniSONE (DELTASONE) 10 MG tablet    Sig: Take 5 tablets (50 mg total) by mouth daily with breakfast for 5 days, THEN 4 tablets (40 mg total) daily with breakfast for 5 days, THEN 3 tablets (30 mg total) daily with breakfast for 5 days, THEN 2 tablets (20 mg total) daily with breakfast for 5 days, THEN 1 tablet (10 mg total) daily with breakfast for 10 days, THEN 0.5 tablets (5 mg total) daily with breakfast for 20 days.    Dispense:  90 tablet    Refill:  0   Time: 50 minutes, >50% spent counseling, care coordination, chart review, and documentation.    SOrma Render NP

## 2020-11-25 NOTE — Patient Instructions (Addendum)
I have sent in a steroid taper for a total of 50 days.  You will decrease the dose by 10mg  every 5 days until you reach 1 tablet per day Once you reach 1 tablet for day, you will continue that dose for 10 days then decrease to 1/2 tablet per day for 20 days.   This will give an idea if the 5mg  dose will be effective for you.   I am checking for lupus and other autoimmune and inflammatory conditions. These tests can take up to 2 weeks to come back, but we will be in contact when we have all of the results.

## 2020-11-25 NOTE — Assessment & Plan Note (Addendum)
Unclear if oral dryness is associated with external manifestation or if this is a possible autoimmune or exanthemous exacerbation of chronic condition.  Responds well to oral steroid long taper.  Will test today for autoimmune component and start on long term taper of prednisone.  Consider low dose daily prednisone if symptoms rebound.  Referral to rheumatology for further evaluation and recommendations.  F/U in 4 weeks

## 2020-11-25 NOTE — Assessment & Plan Note (Signed)
3-5mm scattered erythematous papules bilaterally on flank. No further involvement present at this time, however, historically this has been an issue.  Will test today for autoimmune conditions that could be present given her prolonged symptoms and response to steroid treatment in the past.  Consider possible long term 5mg  prednisone if this is effective. Education provided today that we may not be able to determine cause, but will proceed with testing. Will attempt to get records for review.  F/U in 4 weeks

## 2020-11-30 ENCOUNTER — Ambulatory Visit: Payer: 59

## 2020-11-30 ENCOUNTER — Ambulatory Visit
Admission: RE | Admit: 2020-11-30 | Discharge: 2020-11-30 | Disposition: A | Discharge status: Home / Self Care | Payer: 59 | Source: Ambulatory Visit | Attending: Nurse Practitioner | Admitting: Nurse Practitioner

## 2020-11-30 ENCOUNTER — Other Ambulatory Visit: Payer: Self-pay

## 2020-11-30 DIAGNOSIS — R928 Other abnormal and inconclusive findings on diagnostic imaging of breast: Secondary | ICD-10-CM

## 2020-11-30 DIAGNOSIS — R922 Inconclusive mammogram: Secondary | ICD-10-CM | POA: Diagnosis not present

## 2020-12-02 NOTE — Progress Notes (Signed)
Please call pt:   Your mammogram results show no changes in your results from previous exams. At this time there is no evidence of breast cancer. We will plan to repeat this in 1 year for regularly scheduled screening.  If you should have concerns or changes in your breasts within the next year, please let us know.   Chelsea Hill

## 2020-12-08 ENCOUNTER — Telehealth (HOSPITAL_BASED_OUTPATIENT_CLINIC_OR_DEPARTMENT_OTHER): Payer: Self-pay

## 2020-12-08 NOTE — Telephone Encounter (Signed)
Called patient to discuss mammogram results. Patient is aware and agreeable to plan. Instructed patient to contact the office with questions and concern.

## 2020-12-08 NOTE — Telephone Encounter (Signed)
-----   Message from Tollie Eth, NP sent at 12/02/2020  8:27 AM EDT ----- Please call pt:   Your mammogram results show no changes in your results from previous exams. At this time there is no evidence of breast cancer. We will plan to repeat this in 1 year for regularly scheduled screening.  If you should have concerns or changes in your breasts within the next year, please let us know.   Chelsea Hill

## 2020-12-14 LAB — MYOMARKER 3 PLUS PROFILE (RDL)
Anti-EJ Ab (RDL): NEGATIVE
Anti-Jo-1 Ab (RDL): <20 Units (ref <20)
Anti-Ku Ab (RDL): NEGATIVE
Anti-MDA-5 Ab (CADM-140)(RDL): <20 Units (ref <20)
Anti-Mi-2 Ab (RDL): POSITIVE — AB
Anti-NXP-2 (P140) Ab (RDL): <20 Units (ref <20)
Anti-OJ Ab (RDL): NEGATIVE
Anti-PL-12 Ab (RDL: NEGATIVE
Anti-PL-7 Ab (RDL): NEGATIVE
Anti-PM/Scl-100 Ab (RDL): <20 Units (ref <20)
Anti-SAE1 Ab, IgG (RDL): <20 Units (ref <20)
Anti-SRP Ab (RDL): NEGATIVE
Anti-SS-A 52kD Ab, IgG (RDL): <20 Units (ref <20)
Anti-TIF-1gamma Ab (RDL): <20 Units (ref <20)
Anti-U1 RNP Ab (RDL): <20 Units (ref <20)
Anti-U2 RNP Ab (RDL): NEGATIVE
Anti-U3 RNP (Fibrillarin)(RDL): NEGATIVE

## 2020-12-14 LAB — SYSTEMIC LUPUS PROFILE A
Chromatin Ab SerPl-aCnc: <0.2 AI (ref 0.0–0.9)
ENA RNP Ab: <0.2 AI (ref 0.0–0.9)
ENA SM Ab Ser-aCnc: <0.2 AI (ref 0.0–0.9)
ENA SSA (RO) Ab: <0.2 AI (ref 0.0–0.9)
ENA SSB (LA) Ab: <0.2 AI (ref 0.0–0.9)
Rheumatoid fact SerPl-aCnc: <10 IU/mL (ref <14.0)
dsDNA Ab: <1 IU/mL (ref 0–9)

## 2020-12-14 LAB — SYSTEMIC LUPUS PROFILE B
Anti Nuclear Antibody (ANA): NEGATIVE
Complement C3, Serum: 83 mg/dL (ref 82–167)
Complement C4, Serum: 18 mg/dL (ref 12–38)

## 2020-12-14 LAB — C-REACTIVE PROTEIN: CRP: <1 mg/L (ref 0–10)

## 2020-12-14 LAB — SEDIMENTATION RATE: Sed Rate: 2 mm/hr (ref 0–32)

## 2020-12-15 NOTE — Progress Notes (Signed)
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 This antibody is found in some patients with certain autoimmune conditions.   One condition that could be considered based on symptoms is Dermatomyositis This is an autoimmune condition that primarily affects the skin and skeletal muscles and can present with similar symptoms to what patient is experiencing. Important to note that this is NOT conclusive evidence or a diagnosis of the condition, but having these results may help rheumatology make a more concise diagnosis.   If she needs the records printed or faxed to rheumatology, please complete that so they can review what we have so far. There is one additional test that is still pending.

## 2020-12-16 ENCOUNTER — Telehealth (HOSPITAL_BASED_OUTPATIENT_CLINIC_OR_DEPARTMENT_OTHER): Payer: Self-pay

## 2020-12-16 NOTE — Telephone Encounter (Signed)
Called patient to discuss lab results.  Patient stated she was at an appointment and would call back later.

## 2020-12-16 NOTE — Telephone Encounter (Signed)
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 This antibody is found in some patients with certain autoimmune conditions.   One condition that could be considered based on symptoms is Dermatomyositis This is an autoimmune condition that primarily affects the skin and skeletal muscles and can present with similar symptoms to what patient is experiencing. Important to note that this is NOT conclusive evidence or a diagnosis of the condition, but having these results may help rheumatology make a more concise diagnosis.   If she needs the records printed or faxed to rheumatology, please complete that so they can review what we have so far. There is one additional test that is still pending.  

## 2020-12-17 ENCOUNTER — Telehealth (HOSPITAL_BASED_OUTPATIENT_CLINIC_OR_DEPARTMENT_OTHER): Payer: Self-pay

## 2020-12-17 NOTE — Telephone Encounter (Signed)
Left message for patient to call back for results and recommendations. 

## 2020-12-21 ENCOUNTER — Telehealth (HOSPITAL_BASED_OUTPATIENT_CLINIC_OR_DEPARTMENT_OTHER): Payer: Self-pay

## 2020-12-21 NOTE — Telephone Encounter (Signed)
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 This antibody is found in some patients with certain autoimmune conditions.   One condition that could be considered based on symptoms is Dermatomyositis This is an autoimmune condition that primarily affects the skin and skeletal muscles and can present with similar symptoms to what patient is experiencing. Important to note that this is NOT conclusive evidence or a diagnosis of the condition, but having these results may help rheumatology make a more concise diagnosis.   If she needs the records printed or faxed to rheumatology, please complete that so they can review what we have so far. There is one additional test that is still pending.  

## 2020-12-21 NOTE — Telephone Encounter (Signed)
Left message for patient to call back for results and recommendations. 

## 2020-12-23 ENCOUNTER — Encounter (HOSPITAL_BASED_OUTPATIENT_CLINIC_OR_DEPARTMENT_OTHER): Payer: Self-pay

## 2020-12-30 ENCOUNTER — Telehealth (HOSPITAL_BASED_OUTPATIENT_CLINIC_OR_DEPARTMENT_OTHER): Payer: Self-pay

## 2020-12-30 NOTE — Telephone Encounter (Signed)
Attempted to make contact with patient several times to discuss lab results with no success. Mailed letter on 12/23/20.

## 2020-12-30 NOTE — Telephone Encounter (Signed)
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 This antibody is found in some patients with certain autoimmune conditions.   One condition that could be considered based on symptoms is Dermatomyositis This is an autoimmune condition that primarily affects the skin and skeletal muscles and can present with similar symptoms to what patient is experiencing. Important to note that this is NOT conclusive evidence or a diagnosis of the condition, but having these results may help rheumatology make a more concise diagnosis.   If she needs the records printed or faxed to rheumatology, please complete that so they can review what we have so far. There is one additional test that is still pending.

## 2021-01-11 ENCOUNTER — Other Ambulatory Visit: Payer: Self-pay

## 2021-01-11 ENCOUNTER — Telehealth (INDEPENDENT_AMBULATORY_CARE_PROVIDER_SITE_OTHER): Payer: 59 | Admitting: Nurse Practitioner

## 2021-01-11 ENCOUNTER — Encounter (HOSPITAL_BASED_OUTPATIENT_CLINIC_OR_DEPARTMENT_OTHER): Payer: Self-pay | Admitting: Nurse Practitioner

## 2021-01-11 ENCOUNTER — Encounter (HOSPITAL_BASED_OUTPATIENT_CLINIC_OR_DEPARTMENT_OTHER): Payer: Self-pay

## 2021-01-11 VITALS — Ht 63.0 in | Wt 112.0 lb

## 2021-01-11 DIAGNOSIS — H01119 Allergic dermatitis of unspecified eye, unspecified eyelid: Secondary | ICD-10-CM

## 2021-01-11 DIAGNOSIS — U071 COVID-19: Secondary | ICD-10-CM

## 2021-01-11 DIAGNOSIS — R21 Rash and other nonspecific skin eruption: Secondary | ICD-10-CM | POA: Diagnosis not present

## 2021-01-11 HISTORY — DX: COVID-19: U07.1

## 2021-01-11 MED ORDER — ONDANSETRON 8 MG PO TBDP: 8.0000 mg | ORAL_TABLET | Freq: Three times a day (TID) | ORAL | 3 refills | Status: DC | PRN

## 2021-01-11 MED ORDER — PREDNISONE 5 MG PO TABS: 5.0000 mg | ORAL_TABLET | Freq: Every day | ORAL | 3 refills | Status: DC

## 2021-01-11 NOTE — Assessment & Plan Note (Addendum)
>>  ASSESSMENT AND PLAN FOR PSORIATIC ARTHRITIS (HCC) WRITTEN ON 07/10/2023  7:40 AM BY Jessaca Philippi E, NP   >>ASSESSMENT AND PLAN FOR EYELID DERMATITIS, ALLERGIC/CONTACT WRITTEN ON 01/11/2021  6:49 PM BY Andreea Arca E, NP  Resolution of symptoms with low dose prednisone and claritin daily regimen. Given the significant symptoms that were present and the complete resolution, discussion was made with patient to continue current regimen for another 4-6 weeks to determine if slow wean could be possible.  She is agreeable to plan.  We will meet again in appx 6 weeks for CPE and determine if medication wean was successful or continued regimen is required.  Given the recent lab results, it is likely an autoimmune component is facilitating her symptoms and therefore, prolonged low dose steroid may be required for management.    >>ASSESSMENT AND PLAN FOR RASH WRITTEN ON 01/11/2021  6:50 PM BY Cady Hafen E, NP  Resolution of symptoms with low dose prednisone and claritin daily regimen. Given the significant symptoms that were present and the complete resolution, discussion was made with patient to continue current regimen for another 4-6 weeks to determine if slow wean could be possible.  She is agreeable to plan.  We will meet again in appx 6 weeks for CPE and determine if medication wean was successful or continued regimen is required.  Given the recent lab results, it is likely an autoimmune component is facilitating her symptoms and therefore, prolonged low dose steroid may be required for management.  It is possible that further testing may help reveal diagnosis and underlying cause, but patient is happy with results as of now. Rheumatology referral placed at last visit.

## 2021-01-11 NOTE — Assessment & Plan Note (Signed)
Resolution of symptoms with low dose prednisone and claritin daily regimen. Given the significant symptoms that were present and the complete resolution, discussion was made with patient to continue current regimen for another 4-6 weeks to determine if slow wean could be possible.  She is agreeable to plan.  We will meet again in appx 6 weeks for CPE and determine if medication wean was successful or continued regimen is required.  Given the recent lab results, it is likely an autoimmune component is facilitating her symptoms and therefore, prolonged low dose steroid may be required for management.  It is possible that further testing may help reveal diagnosis and underlying cause, but patient is happy with results as of now. Rheumatology referral placed at last visit.

## 2021-01-11 NOTE — Assessment & Plan Note (Signed)
Zofran provided for nausea. Patient will follow-up if symptoms worsen or fail to improve.

## 2021-01-11 NOTE — Progress Notes (Signed)
Virtual Visit Encounter     Virtual Visit via Telephone Note   This visit type was conducted due to national recommendations for restrictions regarding the COVID-19 Pandemic (e.g. social distancing) in an effort to limit this patient's exposure and mitigate transmission in our community.  Due to her co-morbid illnesses, this patient is at least at moderate risk for complications without adequate follow up.  This format is felt to be most appropriate for this patient at this time.  The patient did not have access to video technology/had technical difficulties with video requiring transitioning to audio format only (telephone).  All issues noted in this document were discussed and addressed.  No physical exam could be performed with this format.  Please refer to the patient's chart for her  consent to telehealth.  I connected with  [name] on 01/11/21 at  3:30 PM EDT by secure audio and/or video enabled telemedicine application. I verified that I am speaking with the correct person using two identifiers.   I introduced myself as a Publishing rights manager with the practice. We discussed the limitations of evaluation and management by telemedicine and the availability of in person appointments. The patient expressed understanding and agreed to proceed.  Participating parties in this visit include: Myself and patient  The patient is: Patient Location: Home I am: Provider Location: Office/Clinic Subjective:    CC and HPI:  Presents today for follow-up  Chronic rash and joint pain with recent lab tests showing elevation in markers consistent with autoimmune component.  Patient reports that since starting prednisone, she began to have complete resolution of her symptoms. When she came down to the 5mg  dose, she noticed some of the rash beginning to return and added OTC claritin to her medications at that time, which resulted in complete resolution of symptoms.  She reports that she is elated to  have a resolution to her symptoms and would like to continue on the 5mg  prednisone and claritin regimen if possible.   She does tell me that she currently has COVID-19, but is feeling overall well with this. She denies any shortness of breath or chest pain. She is monitoring her symptoms. She has had intermittent nausea and would like zofran to help with this, if possible.   Past medical history, Surgical history, Family history not pertinant except as noted below, Social history, Allergies, and medications have been entered into the medical record, reviewed, and corrections made.   Review of Systems:  All review of systems negative except what is listed in the HPI  Objective:    Alert/Oriented Speaking in complete sentences No shortness of breath  Impression and Recommendations:    Problem List Items Addressed This Visit     Eyelid dermatitis, allergic/contact - Primary    Resolution of symptoms with low dose prednisone and claritin daily regimen. Given the significant symptoms that were present and the complete resolution, discussion was made with patient to continue current regimen for another 4-6 weeks to determine if slow wean could be possible.  She is agreeable to plan.  We will meet again in appx 6 weeks for CPE and determine if medication wean was successful or continued regimen is required.  Given the recent lab results, it is likely an autoimmune component is facilitating her symptoms and therefore, prolonged low dose steroid may be required for management.       Relevant Medications   predniSONE (DELTASONE) 5 MG tablet   Rash in adult    Resolution of symptoms  with low dose prednisone and claritin daily regimen. Given the significant symptoms that were present and the complete resolution, discussion was made with patient to continue current regimen for another 4-6 weeks to determine if slow wean could be possible.  She is agreeable to plan.  We will meet again in appx 6  weeks for CPE and determine if medication wean was successful or continued regimen is required.  Given the recent lab results, it is likely an autoimmune component is facilitating her symptoms and therefore, prolonged low dose steroid may be required for management.  It is possible that further testing may help reveal diagnosis and underlying cause, but patient is happy with results as of now. Rheumatology referral placed at last visit.       Relevant Medications   predniSONE (DELTASONE) 5 MG tablet   COVID-19    Zofran provided for nausea. Patient will follow-up if symptoms worsen or fail to improve.       Relevant Medications   ondansetron (ZOFRAN-ODT) 8 MG disintegrating tablet    orders and follow up as documented in EMR I discussed the assessment and treatment plan with the patient. The patient was provided an opportunity to ask questions and all were answered. The patient agreed with the plan and demonstrated an understanding of the instructions.   The patient was advised to call back or seek an in-person evaluation if the symptoms worsen or if the condition fails to improve as anticipated.  Follow-Up: in a few weeks  I provided 20 minutes of non-face-to-face interaction     This visit type was conducted due to national recommendations for restrictions regarding the COVID-19 Pandemic (e.g. social distancing) in an effort to limit this patient's exposure and mitigate transmission in our community.  Due to her co-morbid illnesses, this patient is at least at moderate risk for complications without adequate follow up.  This format is felt to be most appropriate for this patient at this time.  The patient did not have access to video technology/had technical difficulties with video requiring transitioning to audio format only (telephone).  All issues noted in this document were discussed and addressed.  No physical exam could be performed with this format.  Please refer to the patient's  chart for her  consent to telehealth for [facility] encounter including intake, same-day documentation, and chart review.   Tollie Eth, NP , DNP, AGNP-c [facility] Primary Care & Sports Medicine at [facility] [phone] (fax)

## 2021-01-11 NOTE — Assessment & Plan Note (Signed)
Resolution of symptoms with low dose prednisone and claritin daily regimen. Given the significant symptoms that were present and the complete resolution, discussion was made with patient to continue current regimen for another 4-6 weeks to determine if slow wean could be possible.  She is agreeable to plan.  We will meet again in appx 6 weeks for CPE and determine if medication wean was successful or continued regimen is required.  Given the recent lab results, it is likely an autoimmune component is facilitating her symptoms and therefore, prolonged low dose steroid may be required for management.

## 2021-05-25 ENCOUNTER — Encounter (HOSPITAL_BASED_OUTPATIENT_CLINIC_OR_DEPARTMENT_OTHER): Payer: Self-pay

## 2021-05-25 ENCOUNTER — Other Ambulatory Visit: Payer: Self-pay

## 2021-05-25 ENCOUNTER — Ambulatory Visit
Admission: RE | Admit: 2021-05-25 | Discharge: 2021-05-25 | Disposition: A | Discharge status: Home / Self Care | Payer: 59 | Source: Ambulatory Visit | Attending: Family Medicine | Admitting: Family Medicine

## 2021-05-25 ENCOUNTER — Ambulatory Visit (HOSPITAL_BASED_OUTPATIENT_CLINIC_OR_DEPARTMENT_OTHER): Payer: 59 | Admitting: Family Medicine

## 2021-05-25 ENCOUNTER — Encounter (HOSPITAL_BASED_OUTPATIENT_CLINIC_OR_DEPARTMENT_OTHER): Payer: Self-pay | Admitting: Family Medicine

## 2021-05-25 VITALS — BP 88/60 | HR 83 | Ht 63.0 in | Wt 112.1 lb

## 2021-05-25 DIAGNOSIS — M533 Sacrococcygeal disorders, not elsewhere classified: Secondary | ICD-10-CM | POA: Insufficient documentation

## 2021-05-25 DIAGNOSIS — M25552 Pain in left hip: Secondary | ICD-10-CM | POA: Insufficient documentation

## 2021-05-25 NOTE — Assessment & Plan Note (Signed)
Has been having left hip pain for about 6 months, worsened recently over the past 1 to 2 weeks Pain is primarily over lateral hip, describes as a shooting pain, will occasionally shoot down the outside of the leg down to the knee Pain is worse with crossing legs, abducted position, laying on left side Has had various joints/MSK issues in the past, no specific left hip injury however recalled Denies any associated numbness or tingling Has also had some pain over her coccyx which she reports was triggered by laying in a tanning bed recently On exam, patient with mild tenderness over left greater trochanter, extending some proximally into hip abductor muscle mass.  Negative FABER and FADIR.  Normal strength for hip abduction and abduction.  Ober test without significant pain.  Pain elicited with Trendelenburg test.  Discussed that symptoms seem most likely related to greater trochanteric pain syndrome.  Discussed recommended treatment We will proceed with initial x-ray imaging given duration of symptoms Will refer to physical therapy downstairs for further treatment Can continue with conservative measures to help with pain control Will provide note for accommodations at work Plan for follow-up in about 6 to 8 weeks to monitor progress If continue to have symptoms, could consider greater trochanteric bursa corticosteroid injection, could also consider advanced imaging

## 2021-05-25 NOTE — Progress Notes (Signed)
° ° °  Procedures performed today:    None.  Independent interpretation of notes and tests performed by another provider:   None.  Brief History, Exam, Impression, and Recommendations:    BP (!) 88/60    Pulse 83    Ht 5\' 3"  (1.6 m)    Wt 112 lb 1.6 oz (50.8 kg)    SpO2 98%    BMI 19.86 kg/m   Greater trochanteric pain syndrome of left lower extremity Has been having left hip pain for about 6 months, worsened recently over the past 1 to 2 weeks Pain is primarily over lateral hip, describes as a shooting pain, will occasionally shoot down the outside of the leg down to the knee Pain is worse with crossing legs, abducted position, laying on left side Has had various joints/MSK issues in the past, no specific left hip injury however recalled Denies any associated numbness or tingling Has also had some pain over her coccyx which she reports was triggered by laying in a tanning bed recently On exam, patient with mild tenderness over left greater trochanter, extending some proximally into hip abductor muscle mass.  Negative FABER and FADIR.  Normal strength for hip abduction and abduction.  Ober test without significant pain.  Pain elicited with Trendelenburg test.  Discussed that symptoms seem most likely related to greater trochanteric pain syndrome.  Discussed recommended treatment We will proceed with initial x-ray imaging given duration of symptoms Will refer to physical therapy downstairs for further treatment Can continue with conservative measures to help with pain control Will provide note for accommodations at work Plan for follow-up in about 6 to 8 weeks to monitor progress If continue to have symptoms, could consider greater trochanteric bursa corticosteroid injection, could also consider advanced imaging   ___________________________________________ Giovanie Lefebre de , MD, ABFM, CAQSM Primary Care and Sports Medicine [facility]

## 2021-05-25 NOTE — Patient Instructions (Signed)
°  Medication Instructions:  Your physician recommends that you continue on your current medications as directed. Please refer to the Current Medication list given to you today. --If you need a refill on any your medications before your next appointment, please call your pharmacy first. If no refills are authorized on file call the office.-- Referrals/Procedures/Imaging: Your physician recommends that you have an XRAY of your Left Hip, Pelvis, and Coccyx. X-rays are a type of radiation called electromagnetic waves. X-ray imaging creates pictures of the inside of your body. The images show the parts of your body in different shades of black and white. This is because different tissues absorb different amounts of radiation. Calcium in bones absorbs x-rays the most, so bones look white. Fat and other soft tissues absorb less and look gray. Air absorbs the least, so lungs look black     1. You may have this done at the [facility], located in the [facility] Building on the 1st floor.    2. You do no have to have an appointment.    3. [address] [place], [place] [address]        [phone]        Monday - Friday  8:00 am - 5:00 pm 4. [facility]  [facility]. [facility] at [facility] is a premier diagnostic imaging center that offers a 64-slice CT scanner and interventional radiology services.   Follow-Up: Your next appointment:   Your physician recommends that you schedule a follow-up appointment in: 6-8 WEEKS with Shawna Clamp, DNP  You will receive a text message or e-mail with a link to a survey about your care and experience with [name] today! We would greatly appreciate your feedback!   Thanks for letting us be apart of your health journey!!  Primary Care and Sports Medicine   Dr. Ceasar Mons [place]   We encourage you to activate your patient portal called "MyChart".  Sign up information is  provided on this After Visit Summary.  MyChart is used to connect with patients for Virtual Visits (Telemedicine).  Patients are able to view lab/test results, encounter notes, upcoming appointments, etc.  Non-urgent messages can be sent to your provider as well. To learn more about what you can do with MyChart, please visit --  [email or web address].

## 2021-05-26 ENCOUNTER — Encounter (HOSPITAL_BASED_OUTPATIENT_CLINIC_OR_DEPARTMENT_OTHER): Payer: Self-pay | Admitting: Nurse Practitioner

## 2021-05-30 ENCOUNTER — Encounter (HOSPITAL_BASED_OUTPATIENT_CLINIC_OR_DEPARTMENT_OTHER): Payer: Self-pay | Admitting: Physical Therapy

## 2021-05-31 ENCOUNTER — Other Ambulatory Visit (HOSPITAL_BASED_OUTPATIENT_CLINIC_OR_DEPARTMENT_OTHER): Payer: Self-pay | Admitting: Nurse Practitioner

## 2021-05-31 DIAGNOSIS — M533 Sacrococcygeal disorders, not elsewhere classified: Secondary | ICD-10-CM

## 2021-05-31 MED ORDER — PREDNISONE 50 MG PO TABS: 50.0000 mg | ORAL_TABLET | Freq: Every day | ORAL | 0 refills | Status: DC

## 2021-06-07 ENCOUNTER — Encounter (HOSPITAL_BASED_OUTPATIENT_CLINIC_OR_DEPARTMENT_OTHER): Payer: Self-pay | Admitting: Nurse Practitioner

## 2021-06-07 ENCOUNTER — Other Ambulatory Visit: Payer: Self-pay

## 2021-06-07 ENCOUNTER — Ambulatory Visit (INDEPENDENT_AMBULATORY_CARE_PROVIDER_SITE_OTHER): Payer: 59 | Admitting: Nurse Practitioner

## 2021-06-07 DIAGNOSIS — M5441 Lumbago with sciatica, right side: Secondary | ICD-10-CM | POA: Insufficient documentation

## 2021-06-07 DIAGNOSIS — M5442 Lumbago with sciatica, left side: Secondary | ICD-10-CM

## 2021-06-07 MED ORDER — IBUPROFEN 800 MG PO TABS: 800.0000 mg | ORAL_TABLET | Freq: Three times a day (TID) | ORAL | 6 refills | Status: AC | PRN

## 2021-06-07 MED ORDER — PREDNISONE 10 MG PO TABS: ORAL_TABLET | ORAL | 0 refills | Status: AC

## 2021-06-07 NOTE — Assessment & Plan Note (Signed)
Pain consistent with impingement. X-ray imaging negative for bony or soft tissue abnormality.  MRI order has been placed and pending approval. We will work to get this completed as soon as possible. Will send prednisone taper for patient to see if this helps with rest.  Will also send referral to spine specialist to be seen once she returns from her trip. Hopefully we will have MRI results by that time.  Recommend ice 20 minutes at a time 4-5 times a day.  Recommend donut pillow while seated to help relieve pressure on low back.  Recommend gentle stretching exercises.  Recommend TENS unit to help with pain- particularly while flying. Continue weight restriction.  Will write off of work for 2 days to allow for rest and recovery.  Discussed alarm/emergency symptoms that would require immediate evaluation. She expressed understanding.  Will plan to follow-up after MRI. She returns from trip 02/17 and may need to be seen sooner if symptoms have not changed.

## 2021-06-07 NOTE — Progress Notes (Signed)
Virtual Visit Encounter telephone visit.   I connected with  Chelsea Hill on 06/07/21 at  8:10 AM EST by secure audio and/or video enabled telemedicine application. I verified that I am speaking with the correct person using two identifiers.   I introduced myself as a Publishing rights manager with the practice. The limitations of evaluation and management by telemedicine discussed with the patient and the availability of in person appointments. The patient expressed verbal understanding and consent to proceed.  Participating parties in this visit include: Myself and patient  The patient is: Patient Location: Home I am: Provider Location: Office/Clinic Subjective:    CC and HPI: Chelsea Hill is a 46 y.o. year old female presenting for follow up of back pain/hip pain.  She was seen by Dr. Ihor Dow a few weeks ago for hip/low back pain.  She reports the pain started almost immediately after getting out of a tanning bed. She tells me the pain started in her hip, but has since migrated to the low back and is now bilateral in nature.  She did have x-rays done in January which showed no bony or soft tissue abnormality present.  A few days after seeing Dr. Ihor Dow she reports developing a "lump" on the right lower back at the area of the sacroiliac joint. She reports the lump was hard. This resolved after about 2-3 days spontaneously after starting a steroid burst.  She tells me since she was seen the pain is significant. She tells me that sitting, standing, walking all causes severe pain. She tells me that the only relief she has is laying down on her right side.  She reports that she feels like this coming from the nerve in her back.  She does report that she feels the area of her coccyx feels swollen and hard at this time.   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 She has been on a 20lb weight restriction, but reports this has been extremely difficult to comply with due to the  nature of her job. She tells me she felt like the steroids were helpful, but towards the end of the day with all of the movement required it was unbearable again.   She endorses bilateral low back/sacral pain with radiation into both buttocks and around the anterior thighs to the knees. She endorses at the end of the day the pain radiates like a band around the low back and pelvis.  She has felt some weakness in her thighs and numbness in the right knee on occasion.  She denies loss of bowel or bladder control or falls.   She is leaving for [place] in 2 days and is concerned about exacerbation of symptoms while she is gone. She requests a taper of the steroid as this did help with her pain and she is hopeful that with rest she will have some relief.   Past medical history, Surgical history, Family history not pertinant except as noted below, Social history, Allergies, and medications have been entered into the medical record, reviewed, and corrections made.   Review of Systems:  All review of systems negative except what is listed in the HPI  Objective:    Alert and oriented x 4 Speaking in clear sentences with no shortness of breath. No distress.  Impression and Recommendations:    Problem List Items Addressed This Visit     Acute bilateral low back pain with bilateral sciatica - Primary    Pain  consistent with impingement. X-ray imaging negative for bony or soft tissue abnormality.  MRI order has been placed and pending approval. We will work to get this completed as soon as possible. Will send prednisone taper for patient to see if this helps with rest.  Will also send referral to spine specialist to be seen once she returns from her trip. Hopefully we will have MRI results by that time.  Recommend ice 20 minutes at a time 4-5 times a day.  Recommend donut pillow while seated to help relieve pressure on low back.  Recommend gentle stretching exercises.  Recommend TENS unit to  help with pain- particularly while flying. Continue weight restriction.  Will write off of work for 2 days to allow for rest and recovery.  Discussed alarm/emergency symptoms that would require immediate evaluation. She expressed understanding.  Will plan to follow-up after MRI. She returns from trip 02/17 and may need to be seen sooner if symptoms have not changed.       Relevant Medications   predniSONE (DELTASONE) 10 MG tablet   ibuprofen (ADVIL) 800 MG tablet   Other Relevant Orders   Ambulatory referral to Spine Surgery    orders and follow up as documented in EMR I discussed the assessment and treatment plan with the patient. The patient was provided an opportunity to ask questions and all were answered. The patient agreed with the plan and demonstrated an understanding of the instructions.   The patient was advised to call back or seek an in-person evaluation if the symptoms worsen or if the condition fails to improve as anticipated.  Follow-Up: pending MRI results  I provided 46 minutes of non-face-to-face interaction with this non face-to-face encounter including intake, same-day documentation, and chart review.   Tollie Eth, NP , DNP, AGNP-c [facility] at [facility] [phone] (fax)

## 2021-06-07 NOTE — Patient Instructions (Addendum)
I want you to ice the back for 20 minutes at a time at least 4 times a day for the next 2 days. Ice before getting on the plane and as soon as you get off the plane and while you are on vacation.   I have written you out of work for the next 2 days. Hopefully this will help you rest and get some relief from the pain prior to the trip.   I do recommend trying a TENS unit especially while on the plane to see if this will help with the pain.

## 2021-06-08 ENCOUNTER — Ambulatory Visit: Payer: Self-pay

## 2021-06-08 NOTE — Telephone Encounter (Signed)
Pt called in stating that she didn't receive prednisone that was sent to pharmacy yesterday. She contacted them and was told rx wasn't received from provider. Advised her I will call pharmacy and check status and give her a call back. Called and spoke with Luisa Hart, [facility] who was able to let me give verbal for medication and states rx would be ready in less than 1 hr. Called pt back to notify her and no further assistance was needed.   Reason for Disposition  [1] Prescription prescribed recently is not at pharmacy AND [2] triager has access to patient's EMR AND [3] prescription is recorded in the EMR  Answer Assessment - Initial Assessment Questions 1. NAME of MEDICATION: "What medicine are you calling about?"     prednisone 2. QUESTION: "What is your question?" (e.g., double dose of medicine, side effect)     Isn't at pharmacy  3. PRESCRIBING HCP: "Who prescribed it?" Reason: if prescribed by specialist, call should be referred to that group.     Enid Skeens, NP  Protocols used: Medication Question Call-A-AH

## 2021-06-17 DIAGNOSIS — R197 Diarrhea, unspecified: Secondary | ICD-10-CM | POA: Diagnosis not present

## 2021-06-20 NOTE — Therapy (Signed)
OUTPATIENT PHYSICAL THERAPY LOWER EXTREMITY EVALUATION   Patient Name: Chelsea Hill MRN: [id number] DOB:03/01/1976, 46 y.o., female Today's Date: 06/21/2021   PT End of Session - 06/21/21 1351     Visit Number 1    Number of Visits 12    Authorization Type [place] [employer]    PT Start Time 1349    PT Stop Time 1438    PT Time Calculation (min) 49 min    Activity Tolerance Patient tolerated treatment well    Behavior During Therapy [facility] for tasks assessed/performed             Past Medical History:  Diagnosis Date   Alcohol addiction (HCC)    Anxiety    Depression    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HCC)    Hyperlipidemia    Thyroid disease    Past Surgical History:  Procedure Laterality Date   ABDOMINAL HYSTERECTOMY     SPINE SURGERY     Patient Active Problem List   Diagnosis Date Noted   Acute bilateral low back pain with bilateral sciatica 06/07/2021   Greater trochanteric pain syndrome of left lower extremity 05/25/2021   Coccyx pain 05/25/2021   COVID-19 01/11/2021   Joint pain 11/25/2020   Rash in adult 11/25/2020   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   Tobacco use disorder, continuous 11/25/2020   Hypothyroidism due to Hashimoto's thyroiditis 10/07/2020   Anxiety and depression 10/07/2020   Eyelid dermatitis, allergic/contact 10/07/2020    PCP: Tollie Eth, NP  REFERRING PROVIDER: de [place], Raymond J, MD  REFERRING DIAG: [phone] (ICD-10-CM) - Greater trochanteric pain syndrome of left lower extremity  M53.3 (ICD-10-CM) - Coccyx pain   THERAPY DIAG:  Pain in left hip  Muscle weakness (generalized)  ONSET DATE: September 2022  SUBJECTIVE:   SUBJECTIVE STATEMENT: -Pt reports intermittent L hip pain which began 6 months ago with no specific MOI.  Pt first noticed pain when lying on L side at night.  Her L hip pain has progressively worsened over the past 6 months.  Pt has L lateral hip pain which can travel down lateral thigh to knee when it's  worse and on really bad days can travel to L ankle.  Pt states it doesn't feel like nerve/sciatic pain.  Pt states she feels spasms deep in her lateral hip.   -Pt reports having sacrum/coccyx pain starting 4-5 weeks ago when lying on a tanning bed which increased when  getting out of a tanning bed.  Pt states she noticed she had an area on her R lower lumbar that was discolored, red and purple, immediately when getting out of tanning bed.  This dissipated in 4-5 hours, but her pain worsened. Pt states she developed a lump the following day which she does not have now.  Pt went to MD and was put on lifting restrictions at work.  She also had an x ray and was prescribed a steroid taper.  Pt reports she felt better after 2-3 days of steroid taper.  Pt had increased pain with work activities and continued to have significant pain.  Pt c/o's of increased lumbar, sacral, and LE pain.  Pt saw PA who ordered a prednisone taper, MRI, and also referred pt to a spine specialist with a dx of Acute bilateral low back pain with bilateral sciatica to be seen once she returns from her trip.       -Pt reports improved pain and mobility on the 2nd round of steroids.  Pt went  on vacation to [place] and did a lot of walking.  Pt reports having much improved pain during her vacation and actually the pain went away.  Pt and family became sick after eating at a restaurant at the resort in [place] and had GI sx's including diarrhea.  Pt reports being sick for 8 days.  She saw MD and testing performed and is awaiting lab results.  Pt was put on antibiotics.  She has only started feeling better the past 2 days and today is her best day.      PERTINENT HISTORY: Hypothyroidism due to Hashimoto's thyroiditis, Anxiety, Depression, and C4-5 discectomy fusion  PAIN:  Are you having pain? No NPRS scale: 0/10 current and best, 10/10 worst Pain location: L lateral hip pain; coccyx/sacrum.  Pain can travel down lateral L  LE Aggravating factors: prolonged sitting, sitting with feet in the seat and hips ER, lying on L side, work activities Relieving factors: Using a pillow b/w knees and lying on R side, walking  PRECAUTIONS: Other: Recent sickness with long-lasting GI sx's when eating on her vacation out of the country   WEIGHT BEARING RESTRICTIONS No  FALLS:  Has patient fallen in last 6 months? 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  PLOF: Independent; Pt was able to perform her normal functional mobility skills and work activities without L hip pain or limitation.   PATIENT GOALS to be pain free completely   OBJECTIVE:   DIAGNOSTIC FINDINGS: X rays of hip, pelvis, sacrum, and coccyx:  IMPRESSION: 1. No acute fracture is seen within the pelvis including the sacrum and coccyx. 2. No significant osteoarthritis.  PATIENT SURVEYS:  FOTO Give next visit  COGNITION:  Overall cognitive status: Within functional limits for tasks assessed     MUSCLE LENGTH: Minimal tightness in L piriformis and none in R piriformis  PALPATION: Bilat GT and ITB.  L glute/posterolateral hip  LE AROM/PROM:  A/PROM Right 06/21/2021 Left 06/21/2021  Hip flexion    Hip extension [facility]  Hip abduction WNL WNL  Hip adduction    Hip internal rotation [facility]  Hip external rotation [facility]  Knee flexion    Knee extension    Ankle dorsiflexion    Ankle plantarflexion    Ankle inversion    Ankle eversion     (Blank rows = not tested)  LE MMT:  MMT Right 06/21/2021 Left 06/21/2021  Hip flexion 4/5 4/5  Hip extension 5/5 5/5  Hip abduction 4+/5 4+/5  Hip adduction    Hip internal rotation 5/5 5/5  Hip external rotation 5/5 4/5  Knee flexion 5/5 5/5  Knee extension 5/5 5/5  Ankle dorsiflexion    Ankle plantarflexion    Ankle inversion    Ankle eversion     (Blank rows = not tested)  LOWER EXTREMITY SPECIAL TESTS:  Ober's Test:  negative bilat FABER's Test:  negative bilat   GAIT: Comments: Pt ambulated with a normalized heel to toe gait pattern without limping.  Pt reports 1.5/10 pain with ambulation down the hallway in the clinic.    TODAY'S TREATMENT: Educated pt in soft tissue mobilization using a tennis ball or roller to L ITB.   See below for pt education.    PATIENT EDUCATION:  Education details: POC, Objective findings, rationale of exercises, dx, relevant anatomy, prognosis, and STM to ITB.  PT answered Pt's questions.   Person educated: Patient Education method:  Explanation and Demonstration Education comprehension: verbalized understanding and needs further education   HOME EXERCISE PROGRAM: Will give next visit  ASSESSMENT:  CLINICAL IMPRESSION: Patient is a 46 y.o. female with a dx of Greater trochanteric pain syndrome of left lower extremity and Coccyx pain presenting to the clinic with L hip and L LE pain, L sided sacrum pain, and muscle weakness L > R hip.  Pt reports having increased pain with prolonged sitting, lying on L side, and work activities.  Pt has had 2 rounds of steroids and reports improved pain and sx's.  She went on a trip to [place] and did a lot of walking and reports significantly improved sx's.  Pt states her pain actually went away while on vacation.  Her pain has recently returned since returning home and also being sick.  Pt has not returned to work yet due to being sick.  Pt should benefit from skilled PT services to address impairments and improve overall function.      OBJECTIVE IMPAIRMENTS decreased activity tolerance, decreased mobility, decreased strength, hypomobility, impaired flexibility, and pain.   ACTIVITY LIMITATIONS occupation.   PERSONAL FACTORS Co-morbidity 1: Hashimoto's thyroiditis are also affecting patient's functional outcome.    REHAB POTENTIAL: Good  CLINICAL DECISION MAKING: Stable/uncomplicated  EVALUATION COMPLEXITY: Low   GOALS:   SHORT TERM GOALS:  STG  Name Target Date Goal status  1 Pt will be independent and compliant with HEP for improved pain, strength, flexibility, and function.   Baseline:  07/12/2021 INITIAL  2 Pt's worst pain will be less than 7/10 for improved mobility and performance of work activities.  Baseline:  07/12/2021 INITIAL   LONG TERM GOALS:   LTG Name Target Date Goal status  1 Pt will be able to perform her normal work activities without significant pain or limitation.  Baseline: 08/02/2021 INITIAL  2 Pt will demo 5/5 L hip flex, abd, and ER strength for improved performance of and tolerance with functional mobility and work activities.  Baseline: 08/02/2021 INITIAL  3 Pt will report at least a 70% improvement in pain with sitting activities.  Baseline: 08/02/2021 INITIAL                       PLAN: PT FREQUENCY: 2x/week  PT DURATION: 6 weeks  PLANNED INTERVENTIONS: Therapeutic exercises, Therapeutic activity, Neuro Muscular re-education, Gait training, Patient/Family education, Joint mobilization, Stair training, Aquatic Therapy, Dry Needling, Electrical stimulation, Cryotherapy, Moist heat, Taping, Ultrasound, and Manual therapy  PLAN FOR NEXT SESSION: Establish HEP next visit.  Perform piriformis stretch and S/L clams.  Assess HS flexibility.  STM and rolling to ITB.  FOTO next visit   Audie Clear III PT, DPT 06/21/21 10:56 PM

## 2021-06-21 ENCOUNTER — Encounter (HOSPITAL_BASED_OUTPATIENT_CLINIC_OR_DEPARTMENT_OTHER): Payer: Self-pay | Admitting: Physical Therapy

## 2021-06-21 ENCOUNTER — Other Ambulatory Visit: Payer: Self-pay

## 2021-06-21 ENCOUNTER — Ambulatory Visit (HOSPITAL_BASED_OUTPATIENT_CLINIC_OR_DEPARTMENT_OTHER): Payer: 59 | Attending: Family Medicine | Admitting: Physical Therapy

## 2021-06-21 DIAGNOSIS — M25552 Pain in left hip: Secondary | ICD-10-CM | POA: Insufficient documentation

## 2021-06-21 DIAGNOSIS — M533 Sacrococcygeal disorders, not elsewhere classified: Secondary | ICD-10-CM | POA: Diagnosis not present

## 2021-06-21 DIAGNOSIS — M6281 Muscle weakness (generalized): Secondary | ICD-10-CM | POA: Diagnosis not present

## 2021-06-23 ENCOUNTER — Encounter (HOSPITAL_BASED_OUTPATIENT_CLINIC_OR_DEPARTMENT_OTHER): Payer: Self-pay | Admitting: Physical Therapy

## 2021-06-23 ENCOUNTER — Encounter (HOSPITAL_BASED_OUTPATIENT_CLINIC_OR_DEPARTMENT_OTHER): Payer: Self-pay | Admitting: Nurse Practitioner

## 2021-06-23 ENCOUNTER — Ambulatory Visit (HOSPITAL_BASED_OUTPATIENT_CLINIC_OR_DEPARTMENT_OTHER): Payer: 59 | Admitting: Physical Therapy

## 2021-06-23 ENCOUNTER — Other Ambulatory Visit: Payer: Self-pay

## 2021-06-23 DIAGNOSIS — M25552 Pain in left hip: Secondary | ICD-10-CM | POA: Diagnosis not present

## 2021-06-23 DIAGNOSIS — M533 Sacrococcygeal disorders, not elsewhere classified: Secondary | ICD-10-CM | POA: Diagnosis not present

## 2021-06-23 DIAGNOSIS — M6281 Muscle weakness (generalized): Secondary | ICD-10-CM | POA: Diagnosis not present

## 2021-06-23 NOTE — Therapy (Signed)
OUTPATIENT PHYSICAL THERAPY LOWER EXTREMITY Treatment    Patient Name: Chelsea Hill MRN: [id number] DOB:1975-11-26, 46 y.o., female Today's Date: 06/23/2021     Past Medical History:  Diagnosis Date   Alcohol addiction ([place])    Anxiety    Depression    Drug addiction in remission [facility])    Hyperlipidemia    Thyroid disease    Past Surgical History:  Procedure Laterality Date   ABDOMINAL HYSTERECTOMY     SPINE SURGERY     Patient Active Problem List   Diagnosis Date Noted   Acute bilateral low back pain with bilateral sciatica 06/07/2021   Greater trochanteric pain syndrome of left lower extremity 05/25/2021   Coccyx pain 05/25/2021   COVID-19 01/11/2021   Joint pain 11/25/2020   Rash in adult 11/25/2020   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  ONSET DATE: September 2022  SUBJECTIVE:   SUBJECTIVE STATEMENT: 2/23 The patient felt good until she sat on a hard bench last night. She had pain into both thighs. She feels like it is better today.      -Pt reports intermittent L hip pain which began 6 months ago with no specific MOI.  Pt first noticed pain when lying on L side at night.  Her L hip pain has progressively worsened over the past 6 months.  Pt has L lateral hip pain which can travel down lateral thigh to knee when it's worse and on really bad days can travel to L ankle.  Pt states it doesn't feel like nerve/sciatic pain.  Pt states she feels spasms deep in her lateral hip.   -Pt reports having sacrum/coccyx pain starting 4-5  weeks ago when lying on a tanning bed which increased when  getting out of a tanning bed.  Pt states she noticed she had an area on her R lower lumbar that was discolored, red and purple, immediately when getting out of tanning bed.  This dissipated in 4-5 hours, but her pain worsened. Pt states she developed a lump the following day which she does not have now.  Pt went to MD and was put on lifting restrictions at work.  She also had an x ray and was prescribed a steroid taper.  Pt reports she felt better after 2-3 days of steroid taper.  Pt had increased pain with work activities and continued to have significant pain.  Pt c/o's of increased lumbar, sacral, and LE pain.  Pt saw PA who ordered a prednisone taper, MRI, and also referred pt to a spine specialist with a dx of Acute bilateral low back pain with bilateral sciatica to be seen once she returns from her trip.       -Pt reports improved pain and mobility on the 2nd round of steroids.  Pt went on vacation to [place] and did a lot of walking.  Pt reports having much improved pain during her vacation and actually the pain went away.  Pt and family became sick after eating at a restaurant at the resort in [place] and had GI sx's including diarrhea.  Pt reports  being sick for 8 days.  She saw MD and testing performed and is awaiting lab results.  Pt was put on antibiotics.  She has only started feeling better the past 2 days and today is her best day.      PERTINENT HISTORY: Hypothyroidism due to Hashimoto's thyroiditis, Anxiety, Depression, and C4-5 discectomy fusion  PAIN:  Are you having pain? No 2/23 ( had pain last night)  NPRS scale: 0/10 current and best, 10/10 worst Pain location: L lateral hip pain; coccyx/sacrum.  Pain can travel down lateral L LE Aggravating factors: prolonged sitting, sitting with feet in the seat and hips ER, lying on L side, work activities Relieving factors: Using a pillow b/w knees and lying on R side,  walking  PRECAUTIONS: Other: Recent sickness with long-lasting GI sx's when eating on her vacation out of the country   Wheel[place] No  FALLS:  Has patient fallen in last 6 months? 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  PLOF: Independent; Pt was able to perform her normal functional mobility skills and work activities without L hip pain or limitation.   PATIENT GOALS to be pain free completely   OBJECTIVE:    TODAY'S TREATMENT: 2/23 Manual: trigger point release to gluteal and lumbar spine in side lying   Reviewed self soft tissue mobilization with tennis ball and thera-cane   Stretches:  LTR x20  Piriformis stretch 2x30 sec hold each side   Bridge 2x10 with cuing for technique  Supine clamshell with cuing for breathing red 2x10     Eval Educated pt in soft tissue mobilization using a tennis ball or roller to L ITB.   See below for pt education.    PATIENT EDUCATION:  Education details: POC, Objective findings, rationale of exercises, dx, relevant anatomy, prognosis, and STM to ITB.  PT answered Pt's questions.   Person educated: Patient Education method: Customer service manager Education comprehension: verbalized understanding and needs further education   HOME EXERCISE PROGRAM: Will give next visit  ASSESSMENT:  CLINICAL IMPRESSION: The patient has trigger points in her gluteal and sacral area. She will be having an MRI next week. Therapy performed trigger point release to her gluteal and low back area. She reported no adverse effects. Therapy reviewed base stretching and exercise progression with her. She seemed to tolerate well. She will perfrom her HEP and report back how she tolerated. She [name] unsure when her MRi may be.    OBJECTIVE IMPAIRMENTS decreased activity tolerance, decreased mobility, decreased strength, hypomobility, impaired flexibility, and pain.   ACTIVITY LIMITATIONS  occupation.   PERSONAL FACTORS Co-morbidity 1: Hashimoto's thyroiditis are also affecting patient's functional outcome.    REHAB POTENTIAL: Good  CLINICAL DECISION MAKING: Stable/uncomplicated  EVALUATION COMPLEXITY: Low   GOALS:   SHORT TERM GOALS:  STG Name Target Date Goal status  1 Pt will be independent and compliant with HEP for improved pain, strength, flexibility, and function.   Baseline:  07/14/2021 INITIAL  2 Pt's worst pain will be less than 7/10 for improved mobility and performance of work activities.  Baseline:  07/14/2021 INITIAL   LONG TERM GOALS:   LTG Name Target Date Goal status  1 Pt will be able to perform her normal work activities without significant pain or limitation.  Baseline: 08/04/2021 INITIAL  2 Pt will demo 5/5 L hip flex, abd, and ER strength for improved performance of and tolerance with functional mobility and  work activities.  Baseline: 08/04/2021 INITIAL  3 Pt will report at least a 70% improvement in pain with sitting activities.  Baseline: 08/04/2021 INITIAL                       PLAN: PT FREQUENCY: 2x/week  PT DURATION: 6 weeks  PLANNED INTERVENTIONS: Therapeutic exercises, Therapeutic activity, Neuro Muscular re-education, Gait training, Patient/Family education, Joint mobilization, Stair training, Aquatic Therapy, Dry Needling, Electrical stimulation, Cryotherapy, Moist heat, Taping, Ultrasound, and Manual therapy  PLAN FOR NEXT SESSION: Establish HEP next visit.  Perform piriformis stretch and S/L clams.  Assess HS flexibility.  STM and rolling to ITB.  FOTO next visit   Carolyne Littles PT DPT  06/23/21 10:06 AM

## 2021-06-24 ENCOUNTER — Encounter (HOSPITAL_BASED_OUTPATIENT_CLINIC_OR_DEPARTMENT_OTHER): Payer: Self-pay | Admitting: Physical Therapy

## 2021-06-27 ENCOUNTER — Encounter (HOSPITAL_BASED_OUTPATIENT_CLINIC_OR_DEPARTMENT_OTHER): Payer: 59 | Admitting: Physical Therapy

## 2021-06-30 ENCOUNTER — Encounter (HOSPITAL_BASED_OUTPATIENT_CLINIC_OR_DEPARTMENT_OTHER): Payer: Self-pay | Admitting: Physical Therapy

## 2021-06-30 ENCOUNTER — Ambulatory Visit (HOSPITAL_BASED_OUTPATIENT_CLINIC_OR_DEPARTMENT_OTHER): Payer: 59 | Attending: Family Medicine | Admitting: Physical Therapy

## 2021-06-30 ENCOUNTER — Other Ambulatory Visit: Payer: Self-pay

## 2021-06-30 DIAGNOSIS — M25552 Pain in left hip: Secondary | ICD-10-CM | POA: Diagnosis not present

## 2021-06-30 DIAGNOSIS — M2559 Pain in other specified joint: Secondary | ICD-10-CM | POA: Diagnosis not present

## 2021-06-30 DIAGNOSIS — M6281 Muscle weakness (generalized): Secondary | ICD-10-CM | POA: Insufficient documentation

## 2021-06-30 NOTE — Therapy (Signed)
OUTPATIENT PHYSICAL THERAPY LOWER EXTREMITY Treatment    Patient Name: Chelsea Hill MRN: [id number] DOB:[birth year], 46 y.o., female Today's Date: 06/30/2021   PT End of Session - 06/30/21 1222     Visit Number 3    Number of Visits 12    Date for PT Re-Evaluation 08/02/21    Authorization Type [facility]    PT Start Time 0809    PT Stop Time 0847    PT Time Calculation (min) 38 min    Activity Tolerance Patient tolerated treatment well    Behavior During Therapy [facility] for tasks assessed/performed              Past Medical History:  Diagnosis Date   Alcohol addiction (HCC)    Anxiety    Depression    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HCC)    Hyperlipidemia    Thyroid disease    Past Surgical History:  Procedure Laterality Date   ABDOMINAL HYSTERECTOMY     SPINE SURGERY     Patient Active Problem List   Diagnosis Date Noted   Acute bilateral low back pain with bilateral sciatica 06/07/2021   Greater trochanteric pain syndrome of left lower extremity 05/25/2021   Coccyx pain 05/25/2021   COVID-19 01/11/2021   Joint pain 11/25/2020   Rash in adult 11/25/2020   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 She had pain into both thighs. She feels like it is better today.      -Pt reports intermittent L hip pain which began 6 months ago with no specific MOI.  Pt first  noticed pain when lying on L side at night.  Her L hip pain has progressively worsened over the past 6 months.  Pt has L lateral hip pain which can travel down lateral thigh to knee when it's worse and on really bad days can travel to L ankle.  Pt states it doesn't feel like nerve/sciatic pain.  Pt states she feels spasms deep in her lateral hip.   -Pt reports having sacrum/coccyx pain starting 4-5 weeks ago when lying on a tanning bed which increased when  getting out of a tanning bed.  Pt states she noticed she had an area on her R lower lumbar that was discolored, red and purple, immediately when getting out of tanning bed.  This dissipated in 4-5 hours, but her pain worsened. Pt states she developed a lump the following day which she does not have now.  Pt went to MD and was put on lifting restrictions at work.  She also had an x ray and was prescribed a steroid taper.  Pt reports she felt better after 2-3 days of steroid taper.  Pt had increased pain with work activities and continued to have significant pain.  Pt c/o's of increased lumbar, sacral, and LE pain.  Pt saw PA who ordered a prednisone taper, MRI, and also referred pt to a spine specialist  with a dx of Acute bilateral low back pain with bilateral sciatica to be seen once she returns from her trip.       -Pt reports improved pain and mobility on the 2nd round of steroids.  Pt went on vacation to [place] and did a lot of walking.  Pt reports having much improved pain during her vacation and actually the pain went away.  Pt and family became sick after eating at a restaurant at the resort in [place] and had GI sx's including diarrhea.  Pt reports being sick for 8 days.  She saw MD and testing performed and is awaiting lab results.  Pt was put on antibiotics.  She has only started feeling better the past 2 days and today is her best day.      PERTINENT HISTORY: Hypothyroidism due to Hashimoto's thyroiditis, Anxiety, Depression, and C4-5  discectomy fusion  PAIN:  Are you having pain? No 2/23 ( had pain last night)  NPRS scale: 0/10 current and best, 10/10 worst Pain location: L lateral hip pain; coccyx/sacrum.  Pain can travel down lateral L LE Aggravating factors: prolonged sitting, sitting with feet in the seat and hips ER, lying on L side, work activities Relieving factors: Using a pillow b/w knees and lying on R side, walking  PRECAUTIONS: Other: Recent sickness with long-lasting GI sx's when eating on her vacation out of the country   WEIGHT BEARING RESTRICTIONS No  FALLS:  Has patient fallen in last 6 months? 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  PLOF: Independent; Pt was able to perform her normal functional mobility skills and work activities without L hip pain or limitation.   PATIENT GOALS to be pain free completely   OBJECTIVE:    TODAY'S TREATMENT: 2/28 Bridge with band 2x15 red  Side lying clamshell 2x10   Manual: trigger point release to gluteal and lumbar spine in Supine     2/23 Manual: trigger point release to gluteal and lumbar spine in side lying   Reviewed self soft tissue mobilization with tennis ball and thera-cane   Stretches:  LTR x20  Piriformis stretch 2x30 sec hold each side   Bridge 2x10 with cuing for technique  Supine clamshell with cuing for breathing red 2x10     Eval Educated pt in soft tissue mobilization using a tennis ball or roller to L ITB.   See below for pt education.    PATIENT EDUCATION:  Education details: POC, Objective findings, rationale of exercises, dx, relevant anatomy, prognosis, and STM to ITB.  PT answered Pt's questions.   Person educated: Patient Education method: Medical illustrator Education comprehension: verbalized understanding and needs further education   HOME EXERCISE PROGRAM: Will give next visit  ASSESSMENT:  CLINICAL IMPRESSION:  The patient has worked 5/6 days in a  row. She is sore in her back and hip. She continues to have trigger point. We reviewed LAD with oscillations. She reported improved pain. She feels like her left leg is shorter then her right when she is flaired u. There appeared to be a minor discrepancy which improved with LAD. She was given a heel wedge to try if she feels like it happens again. She was given UE core stability exercises to work on. She is over working her gluteal at work. She would benefit from strengthening that doesn't involve the LE. We will continue to expand her program. After next week She has  10 days off.    OBJECTIVE IMPAIRMENTS decreased activity tolerance, decreased mobility, decreased strength, hypomobility, impaired flexibility, and pain.   ACTIVITY LIMITATIONS occupation.   PERSONAL FACTORS Co-morbidity 1: Hashimoto's thyroiditis are also affecting patient's functional outcome.    REHAB POTENTIAL: Good  CLINICAL DECISION MAKING: Stable/uncomplicated  EVALUATION COMPLEXITY: Low   GOALS:   SHORT TERM GOALS:  STG Name Target Date Goal status  1 Pt will be independent and compliant with HEP for improved pain, strength, flexibility, and function.   Baseline:  07/21/2021 INITIAL  2 Pt's worst pain will be less than 7/10 for improved mobility and performance of work activities.  Baseline:  07/21/2021 INITIAL   LONG TERM GOALS:   LTG Name Target Date Goal status  1 Pt will be able to perform her normal work activities without significant pain or limitation.  Baseline: 08/11/2021 INITIAL  2 Pt will demo 5/5 L hip flex, abd, and ER strength for improved performance of and tolerance with functional mobility and work activities.  Baseline: 08/11/2021 INITIAL  3 Pt will report at least a 70% improvement in pain with sitting activities.  Baseline: 08/11/2021 INITIAL                       PLAN: PT FREQUENCY: 2x/week  PT DURATION: 6 weeks  PLANNED INTERVENTIONS: Therapeutic exercises, Therapeutic activity,  Neuro Muscular re-education, Gait training, Patient/Family education, Joint mobilization, Stair training, Aquatic Therapy, Dry Needling, Electrical stimulation, Cryotherapy, Moist heat, Taping, Ultrasound, and Manual therapy  PLAN FOR NEXT SESSION: Establish HEP next visit.  Perform piriformis stretch and S/L clams.  Assess HS flexibility.  STM and rolling to ITB.  FOTO next visit   Lorayne Bender PT DPT  06/30/21 12:34 PM

## 2021-07-05 ENCOUNTER — Encounter (HOSPITAL_BASED_OUTPATIENT_CLINIC_OR_DEPARTMENT_OTHER): Payer: Self-pay | Admitting: Physical Therapy

## 2021-07-05 ENCOUNTER — Ambulatory Visit (HOSPITAL_BASED_OUTPATIENT_CLINIC_OR_DEPARTMENT_OTHER): Payer: 59 | Admitting: Physical Therapy

## 2021-07-05 ENCOUNTER — Other Ambulatory Visit (HOSPITAL_BASED_OUTPATIENT_CLINIC_OR_DEPARTMENT_OTHER): Payer: Self-pay | Admitting: Nurse Practitioner

## 2021-07-05 ENCOUNTER — Other Ambulatory Visit: Payer: Self-pay

## 2021-07-05 DIAGNOSIS — M533 Sacrococcygeal disorders, not elsewhere classified: Secondary | ICD-10-CM

## 2021-07-05 DIAGNOSIS — M6281 Muscle weakness (generalized): Secondary | ICD-10-CM | POA: Diagnosis not present

## 2021-07-05 DIAGNOSIS — M25552 Pain in left hip: Secondary | ICD-10-CM | POA: Diagnosis not present

## 2021-07-05 DIAGNOSIS — M2559 Pain in other specified joint: Secondary | ICD-10-CM | POA: Diagnosis not present

## 2021-07-05 NOTE — Therapy (Signed)
OUTPATIENT PHYSICAL THERAPY LOWER EXTREMITY Treatment    Patient Name: Chelsea Hill MRN: [id number] DOB:[birth year], 46 y.o., female Today's Date: 07/05/2021   PT End of Session - 07/05/21 1655     Visit Number 4    Number of Visits 12    Date for PT Re-Evaluation 08/02/21    Authorization Type [place] [facility]    PT Start Time 1600    PT Stop Time 1643    PT Time Calculation (min) 43 min    Activity Tolerance Patient tolerated treatment well    Behavior During Therapy [facility] for tasks assessed/performed              Past Medical History:  Diagnosis Date   Alcohol addiction (HCC)    Anxiety    Depression    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HCC)    Hyperlipidemia    Thyroid disease    Past Surgical History:  Procedure Laterality Date   ABDOMINAL HYSTERECTOMY     SPINE SURGERY     Patient Active Problem List   Diagnosis Date Noted   Acute bilateral low back pain with bilateral sciatica 06/07/2021   Greater trochanteric pain syndrome of left lower extremity 05/25/2021   Coccyx pain 05/25/2021   COVID-19 01/11/2021   Joint pain 11/25/2020   Rash in adult 11/25/2020   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 She worked 3 12 hour shifts in a row. She will now be off for 8 days.    -Pt reports intermittent L hip pain which began 6 months ago with no specific MOI.  Pt first noticed  pain when lying on L side at night.  Her L hip pain has progressively worsened over the past 6 months.  Pt has L lateral hip pain which can travel down lateral thigh to knee when it's worse and on really bad days can travel to L ankle.  Pt states it doesn't feel like nerve/sciatic pain.  Pt states she feels spasms deep in her lateral hip.   -Pt reports having sacrum/coccyx pain starting 4-5 weeks ago when lying on a tanning bed which increased when  getting out of a tanning bed.  Pt states she noticed she had an area on her R lower lumbar that was discolored, red and purple, immediately when getting out of tanning bed.  This dissipated in 4-5 hours, but her pain worsened. Pt states she developed a lump the following day which she does not have now.  Pt went to MD and was put on lifting restrictions at work.  She also had an x ray and was prescribed a steroid taper.  Pt reports she felt better after 2-3 days of steroid taper.  Pt had increased pain with work activities and continued to have significant pain.  Pt c/o's of increased lumbar, sacral, and LE pain.  Pt saw PA who ordered a prednisone taper, MRI, and also referred pt to a spine specialist with  a dx of Acute bilateral low back pain with bilateral sciatica to be seen once she returns from her trip.       -Pt reports improved pain and mobility on the 2nd round of steroids.  Pt went on vacation to [place] and did a lot of walking.  Pt reports having much improved pain during her vacation and actually the pain went away.  Pt and family became sick after eating at a restaurant at the resort in [place] and had GI sx's including diarrhea.  Pt reports being sick for 8 days.  She saw MD and testing performed and is awaiting lab results.  Pt was put on antibiotics.  She has only started feeling better the past 2 days and today is her best day.      PERTINENT HISTORY: Hypothyroidism due to Hashimoto's thyroiditis, Anxiety, Depression, and C4-5 discectomy  fusion  PAIN:  Are you having pain? No 2/23 ( had pain last night)  NPRS scale: 0/10 current and best, 10/10 worst Pain location: L lateral hip pain; coccyx/sacrum.  Pain can travel down lateral L LE Aggravating factors: prolonged sitting, sitting with feet in the seat and hips ER, lying on L side, work activities Relieving factors: Using a pillow b/w knees and lying on R side, walking  PRECAUTIONS: Other: Recent sickness with long-lasting GI sx's when eating on her vacation out of the country   WEIGHT BEARING RESTRICTIONS No  FALLS:  Has patient fallen in last 6 months? 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  PLOF: Independent; Pt was able to perform her normal functional mobility skills and work activities without L hip pain or limitation.   PATIENT GOALS to be pain free completely   OBJECTIVE:    TODAY'S TREATMENT: Manual: trigger point release to gluteal and lumbar spine in Supine   LTR x20  Piriformis stretch 2x30 sec hold each side   Bridge 3x10 with cuing for technique  Lateral band walk 3x10 red  SLR 3x10 red      2/28 Bridge with band 2x15 red  Side lying clamshell 2x10   Manual: trigger point release to gluteal and lumbar spine in Supine     2/23 Manual: trigger point release to gluteal and lumbar spine in side lying   Reviewed self soft tissue mobilization with tennis ball and thera-cane   Stretches:  LTR x20  Piriformis stretch 2x30 sec hold each side   Bridge 2x10 with cuing for technique  Supine clamshell with cuing for breathing red 2x10     Eval Educated pt in soft tissue mobilization using a tennis ball or roller to L ITB.   See below for pt education.    PATIENT EDUCATION:  Education details: POC, Objective findings, rationale of exercises, dx, relevant anatomy, prognosis, and STM to ITB.  PT answered Pt's questions.   Person educated: Patient Education method: Software engineer Education comprehension: verbalized understanding and needs further education   HOME EXERCISE PROGRAM: Will give next visit  ASSESSMENT:  CLINICAL IMPRESSION:  The patient continues to have trigger points in her gluteal and lower back. She responded well to manual therapy. She was given some harder exercises to work on over the next few days. She was encouraged to work hard on her harder exercises when she has her breaks and to work on easier things and stretching when she is working for long stretches.   OBJECTIVE IMPAIRMENTS decreased activity  tolerance, decreased mobility, decreased strength, hypomobility, impaired flexibility, and pain.   ACTIVITY LIMITATIONS occupation.   PERSONAL FACTORS Co-morbidity 1: Hashimoto's thyroiditis are also affecting patient's functional outcome.    REHAB POTENTIAL: Good  CLINICAL DECISION MAKING: Stable/uncomplicated  EVALUATION COMPLEXITY: Low   GOALS:   SHORT TERM GOALS:  STG Name Target Date Goal status  1 Pt will be independent and compliant with HEP for improved pain, strength, flexibility, and function.   Baseline:  07/27/2021 INITIAL  2 Pt's worst pain will be less than 7/10 for improved mobility and performance of work activities.  Baseline:  07/27/2021 INITIAL   LONG TERM GOALS:   LTG Name Target Date Goal status  1 Pt will be able to perform her normal work activities without significant pain or limitation.  Baseline: 08/17/2021 INITIAL  2 Pt will demo 5/5 L hip flex, abd, and ER strength for improved performance of and tolerance with functional mobility and work activities.  Baseline: 08/17/2021 INITIAL  3 Pt will report at least a 70% improvement in pain with sitting activities.  Baseline: 08/17/2021 INITIAL                       PLAN: PT FREQUENCY: 2x/week  PT DURATION: 6 weeks  PLANNED INTERVENTIONS: Therapeutic exercises, Therapeutic activity, Neuro Muscular re-education, Gait training, Patient/Family  education, Joint mobilization, Stair training, Aquatic Therapy, Dry Needling, Electrical stimulation, Cryotherapy, Moist heat, Taping, Ultrasound, and Manual therapy  PLAN FOR NEXT SESSION: Establish HEP next visit.  Perform piriformis stretch and S/L clams.  Assess HS flexibility.  STM and rolling to ITB.  FOTO next visit   Lorayne Bender PT DPT  07/06/21 10:28 AM

## 2021-07-06 ENCOUNTER — Encounter (HOSPITAL_BASED_OUTPATIENT_CLINIC_OR_DEPARTMENT_OTHER): Payer: Self-pay | Admitting: Physical Therapy

## 2021-07-07 ENCOUNTER — Ambulatory Visit (HOSPITAL_BASED_OUTPATIENT_CLINIC_OR_DEPARTMENT_OTHER): Payer: 59 | Admitting: Physical Therapy

## 2021-07-11 ENCOUNTER — Encounter (HOSPITAL_BASED_OUTPATIENT_CLINIC_OR_DEPARTMENT_OTHER): Payer: Self-pay | Admitting: Physical Therapy

## 2021-07-11 ENCOUNTER — Ambulatory Visit (HOSPITAL_BASED_OUTPATIENT_CLINIC_OR_DEPARTMENT_OTHER): Payer: 59 | Admitting: Physical Therapy

## 2021-07-11 ENCOUNTER — Other Ambulatory Visit: Payer: Self-pay

## 2021-07-11 DIAGNOSIS — M25552 Pain in left hip: Secondary | ICD-10-CM

## 2021-07-11 DIAGNOSIS — M6281 Muscle weakness (generalized): Secondary | ICD-10-CM

## 2021-07-11 DIAGNOSIS — M2559 Pain in other specified joint: Secondary | ICD-10-CM | POA: Diagnosis not present

## 2021-07-11 NOTE — Therapy (Signed)
OUTPATIENT PHYSICAL THERAPY LOWER EXTREMITY Treatment    Patient Name: Chelsea FabianJessica A Blanck MRN: [id number] DOB:Jan 11, 1976, 46 y.o., female Today's Date: 07/05/2021      Past Medical History:  Diagnosis Date   Alcohol addiction (HCC)    Anxiety    Depression    Drug addiction in remission [facility](HCC)    Hyperlipidemia    Thyroid disease    Past Surgical History:  Procedure Laterality Date   ABDOMINAL HYSTERECTOMY     SPINE SURGERY     Patient Active Problem List   Diagnosis Date Noted   Acute bilateral low back pain with bilateral sciatica 06/07/2021   Greater trochanteric pain syndrome of left lower extremity 05/25/2021   Coccyx pain 05/25/2021   COVID-19 01/11/2021   Joint pain 11/25/2020   Rash in adult 11/25/2020   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  ONSET DATE: September 2022  SUBJECTIVE:   SUBJECTIVE STATEMENT: 2/23 The patient reports that her pain at work was almost unbearable. She worked 3 12 hour shifts in a row. She will now be off for 8 days.    -Pt reports intermittent L hip pain which began 6 months ago with no specific MOI.  Pt first noticed pain when lying on L side at night.  Her L hip pain has progressively worsened over the past 6 months.  Pt has L lateral hip pain which can travel down lateral thigh to knee when it's worse and on really bad days can travel to L ankle.  Pt states it doesn't feel like nerve/sciatic pain.  Pt states she feels spasms deep in her lateral hip.   -Pt reports having sacrum/coccyx pain starting 4-5 weeks  ago when lying on a tanning bed which increased when  getting out of a tanning bed.  Pt states she noticed she had an area on her R lower lumbar that was discolored, red and purple, immediately when getting out of tanning bed.  This dissipated in 4-5 hours, but her pain worsened. Pt states she developed a lump the following day which she does not have now.  Pt went to MD and was put on lifting restrictions at work.  She also had an x ray and was prescribed a steroid taper.  Pt reports she felt better after 2-3 days of steroid taper.  Pt had increased pain with work activities and continued to have significant pain.  Pt c/o's of increased lumbar, sacral, and LE pain.  Pt saw PA who ordered a prednisone taper, MRI, and also referred pt to a spine specialist with a dx of Acute bilateral low back pain with bilateral sciatica to be seen once she returns from her trip.       -Pt reports improved pain and mobility on the 2nd round of steroids.  Pt went on vacation to [place] and did a lot of walking.  Pt reports having much improved pain during her vacation and actually the pain went away.  Pt and family became sick after eating at a restaurant at the resort in [place] and had GI sx's including diarrhea.  Pt reports  being sick for 8 days.  She saw MD and testing performed and is awaiting lab results.  Pt was put on antibiotics.  She has only started feeling better the past 2 days and today is her best day.      PERTINENT HISTORY: Hypothyroidism due to Hashimoto's thyroiditis, Anxiety, Depression, and C4-5 discectomy fusion  PAIN:  Are you having pain? No 2/23 ( had pain last night)  NPRS scale: 0/10 current and best, 10/10 worst Pain location: L lateral hip pain; coccyx/sacrum.  Pain can travel down lateral L LE Aggravating factors: prolonged sitting, sitting with feet in the seat and hips ER, lying on L side, work activities Relieving factors: Using a pillow b/w knees and lying on R side,  walking  PRECAUTIONS: Other: Recent sickness with long-lasting GI sx's when eating on her vacation out of the country   WEIGHT BEARING RESTRICTIONS No  FALLS:  Has patient fallen in last 6 months? 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  PLOF: Independent; Pt was able to perform her normal functional mobility skills and work activities without L hip pain or limitation.   PATIENT GOALS to be pain free completely   OBJECTIVE:    TODAY'S TREATMENT: 3/13 Manual: trigger point release to gluteal and lumbar spine in Supine   Bridge 3x10 with cuing for technique  Supine march x10 march with switch 2x15   Quaruped alt UE x10  Quadruped alt LE x10  Birdog x7      3/7 Manual: trigger point release to gluteal and lumbar spine in Supine   LTR x20  Piriformis stretch 2x30 sec hold each side   Bridge 3x10 with cuing for technique  Lateral band walk 3x10 red  SLR 3x10 red      2/28 Bridge with band 2x15 red  Side lying clamshell 2x10   Manual: trigger point release to gluteal and lumbar spine in Supine     2/23 Manual: trigger point release to gluteal and lumbar spine in side lying   Reviewed self soft tissue mobilization with tennis ball and thera-cane   Stretches:  LTR x20  Piriformis stretch 2x30 sec hold each side   Bridge 2x10 with cuing for technique  Supine clamshell with cuing for breathing red 2x10     Eval Educated pt in soft tissue mobilization using a tennis ball or roller to L ITB.   See below for pt education.    PATIENT EDUCATION:  Education details: POC, Objective findings, rationale of exercises, dx, relevant anatomy, prognosis, and STM to ITB.  PT answered Pt's questions.   Person educated: Patient Education method: Medical illustrator Education comprehension: verbalized understanding and needs further education   HOME EXERCISE PROGRAM: Access Code: [email or web address] URL:  [email or web address] Date: 07/11/2021 Prepared by: Lorayne Bender  Exercises Supine Lower Trunk Rotation - 1 x daily - 7 x weekly - 3 sets - 10 reps Theracane Over Shoulder - 1 x daily - 7 x weekly - 3 sets - 10 reps Standing Glute Med Mobilization with Small Ball on Wall - 1 x daily - 7 x weekly - 3 sets - 10 reps Supine Piriformis Stretch with Foot on Ground - 1 x daily - 7 x weekly - 3 sets - 3 reps - 20sec hold Hooklying Clamshell with Resistance - 1 x daily - 7 x weekly - 3 sets - 10 reps Clamshell - 1 x daily - 7 x weekly - 3 sets -  10 reps Supine Bridge - 1 x daily - 7 x weekly - 3 sets - 10 reps Supine Bridge with Resistance Band - 1 x daily - 7 x weekly - 3 sets - 10 reps Shoulder extension with resistance - Neutral - 1 x daily - 7 x weekly - 3 sets - 10 reps Scapular Retraction with Resistance - 1 x daily - 7 x weekly - 3 sets - 10 reps   ASSESSMENT:  CLINICAL IMPRESSION:  The patients trigger points have improved, although she hasn't been working for 8 days. She will return to work tomorrow> Therapy continues to perform manual therapy to her lower back. We also continue to work on strengthening. She was given a quadruped series to work on. She tolerated well although she felt birdog was difficult but she was able to do it. She will have her MRI next week. We will consider dry needling if it comes back clear.  OBJECTIVE IMPAIRMENTS decreased activity tolerance, decreased mobility, decreased strength, hypomobility, impaired flexibility, and pain.   ACTIVITY LIMITATIONS occupation.   PERSONAL FACTORS Co-morbidity 1: Hashimoto's thyroiditis are also affecting patient's functional outcome.    REHAB POTENTIAL: Good  CLINICAL DECISION MAKING: Stable/uncomplicated  EVALUATION COMPLEXITY: Low   GOALS:   SHORT TERM GOALS:  STG Name Target Date Goal status  1 Pt will be independent and compliant with HEP for improved pain, strength, flexibility, and function.    Baseline:  08/01/2021 INITIAL  2 Pt's worst pain will be less than 7/10 for improved mobility and performance of work activities.  Baseline:  08/01/2021 INITIAL   LONG TERM GOALS:   LTG Name Target Date Goal status  1 Pt will be able to perform her normal work activities without significant pain or limitation.  Baseline: 08/22/2021 INITIAL  2 Pt will demo 5/5 L hip flex, abd, and ER strength for improved performance of and tolerance with functional mobility and work activities.  Baseline: 08/22/2021 INITIAL  3 Pt will report at least a 70% improvement in pain with sitting activities.  Baseline: 08/22/2021 INITIAL                       PLAN: PT FREQUENCY: 2x/week  PT DURATION: 6 weeks  PLANNED INTERVENTIONS: Therapeutic exercises, Therapeutic activity, Neuro Muscular re-education, Gait training, Patient/Family education, Joint mobilization, Stair training, Aquatic Therapy, Dry Needling, Electrical stimulation, Cryotherapy, Moist heat, Taping, Ultrasound, and Manual therapy  PLAN FOR NEXT SESSION: Establish HEP next visit.  Perform piriformis stretch and S/L clams.  Assess HS flexibility.  STM and rolling to ITB.  FOTO next visit   Lorayne Bender PT DPT  07/11/21 8:53 AM

## 2021-07-13 ENCOUNTER — Ambulatory Visit (HOSPITAL_BASED_OUTPATIENT_CLINIC_OR_DEPARTMENT_OTHER): Payer: 59 | Admitting: Physical Therapy

## 2021-07-13 ENCOUNTER — Encounter (HOSPITAL_BASED_OUTPATIENT_CLINIC_OR_DEPARTMENT_OTHER): Payer: Self-pay | Admitting: Physical Therapy

## 2021-07-13 ENCOUNTER — Other Ambulatory Visit: Payer: Self-pay

## 2021-07-13 DIAGNOSIS — M6281 Muscle weakness (generalized): Secondary | ICD-10-CM | POA: Diagnosis not present

## 2021-07-13 DIAGNOSIS — M2559 Pain in other specified joint: Secondary | ICD-10-CM | POA: Diagnosis not present

## 2021-07-13 DIAGNOSIS — M25552 Pain in left hip: Secondary | ICD-10-CM | POA: Diagnosis not present

## 2021-07-13 NOTE — Therapy (Signed)
?OUTPATIENT PHYSICAL THERAPY LOWER EXTREMITY Treatment  ? ? ?Patient Name: Chelsea Hill ?MRN: [id number] ?DOB:10-Jul-1975, 46 y.o., female, female ?Today's Date: 07/05/2021 ? ? ? ? ? ?Past Medical History:  ?Diagnosis Date  ? Alcohol addiction (HCC)   ? Anxiety   ? Depression   ? Drug addiction in remission [facility](HCC)   ? Hyperlipidemia   ? Thyroid disease   ? ?Past Surgical History:  ?Procedure Laterality Date  ? ABDOMINAL HYSTERECTOMY    ? SPINE SURGERY    ? ?Patient Active Problem List  ? Diagnosis Date Noted  ? Acute bilateral low back pain with bilateral sciatica 06/07/2021  ? Greater trochanteric pain syndrome of left lower extremity 05/25/2021  ? Coccyx pain 05/25/2021  ? COVID-19 01/11/2021  ? Joint pain 11/25/2020  ? Rash in adult 11/25/2020  ? 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 Tobacco use disorder, continuous 11/25/2020  ? Hypothyroidism due to Hashimoto's thyroiditis 10/07/2020  ? Anxiety and depression 10/07/2020  ? Eyelid dermatitis, allergic/contact 10/07/2020  ? ? ?PCP: Tollie EthEarly, Sara E, NP ? ?REFERRING PROVIDER: Tollie EthEarly, Sara E, NP ? ?REFERRING DIAG: M25.552 (ICD-10-CM) - Greater trochanteric pain syndrome of left lower extremity  ?M53.3 (ICD-10-CM) - Coccyx pain ? ? ?THERAPY DIAG:  ?No diagnosis found. ? ?ONSET DATE: September 2022 ? ?SUBJECTIVE:  ? ?SUBJECTIVE STATEMENT: ?3/15:  ?The patient has worked a full shift. She is very sore. She reports this is about the peak of her soreness. She has progressive pain as the night goes on.  ? ? ?-Pt reports intermittent L hip pain which began 6 months ago with no specific MOI.  Pt first noticed pain when lying on L side at night.  Her L hip pain has progressively worsened over the past 6 months.  Pt has L lateral hip pain which can travel down lateral thigh to knee when it's worse and on really bad days can travel to L ankle.  Pt states it doesn't feel like nerve/sciatic pain.  Pt states she feels spasms deep in her lateral hip.  ? ?-Pt reports having sacrum/coccyx  pain starting 4-5 weeks ago when lying on a tanning bed which increased when  getting out of a tanning bed.  Pt states she noticed she had an area on her R lower lumbar that was discolored, red and purple, immediately when getting out of tanning bed.  This dissipated in 4-5 hours, but her pain worsened. Pt states she developed a lump the following day which she does not have now.  Pt went to MD and was put on lifting restrictions at work.  She also had an x ray and was prescribed a steroid taper.  Pt reports she felt better after 2-3 days of steroid taper.  Pt had increased pain with work activities and continued to have significant pain.  Pt c/o's of increased lumbar, sacral, and LE pain.  Pt saw PA who ordered a prednisone taper, MRI, and also referred pt to a spine specialist with a dx of Acute bilateral low back pain with bilateral sciatica to be seen once she returns from her trip.  ?     ?-Pt reports improved pain and mobility on the 2nd round of steroids.  Pt went on vacation to [place] and did a lot of walking.  Pt reports having much improved pain during her vacation and actually the pain went away.  Pt and family became sick after eating at a restaurant at the resort in [place] and had GI sx's including diarrhea.  Pt reports being sick for 8 days.  She saw MD and testing performed and is awaiting lab results.  Pt was put on antibiotics.  She has only started feeling better the past 2 days and today is her best day.   ?  ? ?PERTINENT HISTORY: ?Hypothyroidism due to Hashimoto's thyroiditis, Anxiety, Depression, and C4-5 discectomy fusion ? ?PAIN:  ?Are you having pain?Yes 3/15 ?NPRS scale: 8/10 current and best,  ?Pain location: L lateral hip pain; coccyx/sacrum.  Pain can travel down lateral L LE ?Aggravating factors: prolonged sitting, sitting with feet in the seat and hips ER, lying on L side, work activities ?Relieving factors: Using a pillow b/w knees and lying on R side, walking ? ?PRECAUTIONS:  Other: Recent sickness with long-lasting GI sx's when eating on her vacation out of the country  ? ?WEIGHT BEARING RESTRICTIONS No ? ?FALLS:  ?Has patient fallen in last 6 months? No ? ? ? 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 ? ?PLOF: Independent; Pt was able to perform her normal functional mobility skills and work activities without L hip pain or limitation.  ? ?PATIENT GOALS to be pain free completely ? ? ?OBJECTIVE:  ? ? ?TODAY'S TREATMENT: ?3/15 ?Manual: trigger point release to gluteal and lumbar spine in Supine ? ?Reviewed decompression position with ball and without the ball  ?Reviewed rhythmic breathing with leg lengthener x5 each leg  ?Leg press into table with breathing 5 sec each leg   ?           ?Slow rhythmic clam shell with breathing 3x10 yellow  ? ?Reviewed how to use relaxation to reduce central nervous system reaction.  ? ?LTRx20  ?Gluteal stretch 2x20 sec hold on the left.  ? ? ?3/13 ?Manual: trigger point release to gluteal and lumbar spine in Supine ? ? ?Bridge 3x10 with cuing for technique  ?Supine march x10 march with switch 2x15  ? ?Quaruped alt UE x10  ?Quadruped alt LE x10  ?Birdog x7  ? ? ? ? ?3/7 ?Manual: trigger point release to gluteal and lumbar spine in Supine  ? ?LTR x20  ?Piriformis stretch 2x30 sec hold each side  ? ?Bridge 3x10 with cuing for technique  ?Lateral band walk 3x10 red  ?SLR 3x10 red  ? ? ? ? ?2/28 ?Bridge with band 2x15 red  ?Side lying clamshell 2x10  ? ?Manual: trigger point release to gluteal and lumbar spine in Supine  ? ? ? ?2/23 ?Manual: trigger point release to gluteal and lumbar spine in side lying  ? ?Reviewed self soft tissue mobilization with tennis ball and thera-cane  ? ?Stretches:  ?LTR x20  ?Piriformis stretch 2x30 sec hold each side  ? ?Bridge 2x10 with cuing for technique  ?Supine clamshell with cuing for breathing red 2x10  ? ? ? ?Eval ?Educated pt in soft tissue mobilization using a tennis ball or roller to L  ITB.   ?See below for pt education.  ? ? ?PATIENT EDUCATION:  ?Education details: POC, Objective findings, rationale of exercises, dx, relevant anatomy, prognosis, and STM to ITB.  PT answered Pt's questions.   ?Person educated: Patient ?Education method: Explanation and Demonstration ?Education comprehension: verbalized understanding and needs further education ? ? ?HOME EXERCISE PROGRAM: ?Access Code: [email or web address] ?URL: [email or web address] ?Date: 07/11/2021 ?Prepared by: Lorayne Bender ? ?Exercises ?Supine Lower Trunk Rotation - 1 x daily - 7 x weekly - 3 sets - 10 reps ?Theracane Over Shoulder - 1 x daily -  7 x weekly - 3 sets - 10 reps ?Standing Glute Med Mobilization with Small Ball on Wall - 1 x daily - 7 x weekly - 3 sets - 10 reps ?Supine Piriformis Stretch with Foot on Ground - 1 x daily - 7 x weekly - 3 sets - 3 reps - 20sec hold ?Hooklying Clamshell with Resistance - 1 x daily - 7 x weekly - 3 sets - 10 reps ?Clamshell - 1 x daily - 7 x weekly - 3 sets - 10 reps ?Supine Bridge - 1 x daily - 7 x weekly - 3 sets - 10 reps ?Supine Bridge with Resistance Band - 1 x daily - 7 x weekly - 3 sets - 10 reps ?Shoulder extension with resistance - Neutral - 1 x daily - 7 x weekly - 3 sets - 10 reps ?Scapular Retraction with Resistance - 1 x daily - 7 x weekly - 3 sets - 10 reps ? ? ?ASSESSMENT: ? ?CLINICAL IMPRESSION:  ?Therapy used to day to reviewed decompression and deep breathing and how it can be used to centralize pain. Te patient could feel a reduction in pain. We also worked on Immunologist point relese. She had a large trigger point in her gluteal today. That trigger point was not present on the last visit. We reviewed stretching for that area. We will continue to progress as tolerated.  ? ?OBJECTIVE IMPAIRMENTS decreased activity tolerance, decreased mobility, decreased strength, hypomobility, impaired flexibility, and pain.  ? ?ACTIVITY LIMITATIONS occupation.  ? ?PERSONAL FACTORS Co-morbidity  1: Hashimoto's thyroiditis are also affecting patient's functional outcome.  ? ? ?REHAB POTENTIAL: Good ? ?CLINICAL DECISION MAKING: Stable/uncomplicated ? ?EVALUATION COMPLEXITY: Low ? ? ?GOALS: ? ? ?SHOR

## 2021-07-14 ENCOUNTER — Ambulatory Visit
Admission: RE | Admit: 2021-07-14 | Discharge: 2021-07-14 | Disposition: A | Discharge status: Home / Self Care | Payer: 59 | Source: Ambulatory Visit | Attending: Nurse Practitioner | Admitting: Nurse Practitioner

## 2021-07-14 ENCOUNTER — Other Ambulatory Visit: Payer: 59

## 2021-07-14 DIAGNOSIS — M545 Low back pain, unspecified: Secondary | ICD-10-CM | POA: Diagnosis not present

## 2021-07-18 ENCOUNTER — Other Ambulatory Visit: Payer: Self-pay

## 2021-07-18 ENCOUNTER — Ambulatory Visit (HOSPITAL_BASED_OUTPATIENT_CLINIC_OR_DEPARTMENT_OTHER): Payer: 59 | Admitting: Physical Therapy

## 2021-07-18 DIAGNOSIS — M25552 Pain in left hip: Secondary | ICD-10-CM

## 2021-07-18 DIAGNOSIS — M2559 Pain in other specified joint: Secondary | ICD-10-CM | POA: Diagnosis not present

## 2021-07-18 DIAGNOSIS — M6281 Muscle weakness (generalized): Secondary | ICD-10-CM

## 2021-07-18 NOTE — Therapy (Signed)
?OUTPATIENT PHYSICAL THERAPY LOWER EXTREMITY Treatment  ? ? ?Patient Name: Chelsea FabianJessica A Hill ?MRN: [id number] ?DOB:[birth year], 46 y.o., female ?Today's Date: 07/05/2021 ? ? PT End of Session - 07/18/21 1717   ? ? Visit Number 7   ? Number of Visits 12   ? Date for PT Re-Evaluation 08/02/21   ? Authorization Type [facility]   ? PT Start Time 1610   ? PT Stop Time 1655   ? PT Time Calculation (min) 45 min   ? Activity Tolerance Patient tolerated treatment well   ? [facility] for tasks assessed/performed   ? ?  ?  ? ?  ? ? ? ? ?Past Medical History:  ?Diagnosis Date  ? Alcohol addiction (HCC)   ? Anxiety   ? Depression   ? Drug addiction in remission [facility](HCC)   ? Hyperlipidemia   ? Thyroid disease   ? ?Past Surgical History:  ?Procedure Laterality Date  ? ABDOMINAL HYSTERECTOMY    ? SPINE SURGERY    ? ?Patient Active Problem List  ? Diagnosis Date Noted  ? Acute bilateral low back pain with bilateral sciatica 06/07/2021  ? Greater trochanteric pain syndrome of left lower extremity 05/25/2021  ? Coccyx pain 05/25/2021  ? COVID-19 01/11/2021  ? Joint pain 11/25/2020  ? Rash in adult 11/25/2020  ? 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 Tobacco use disorder, continuous 11/25/2020  ? Hypothyroidism due to Hashimoto's thyroiditis 10/07/2020  ? Anxiety and depression 10/07/2020  ? Eyelid dermatitis, allergic/contact 10/07/2020  ? ? ?PCP: Tollie EthEarly, Sara E, NP ? ?REFERRING PROVIDER: Tollie EthEarly, Sara E, NP ? ?REFERRING DIAG: M25.552 (ICD-10-CM) - Greater trochanteric pain syndrome of left lower extremity  ?M53.3 (ICD-10-CM) - Coccyx pain ? ? ?THERAPY DIAG:  ?Pain in left hip ? ?Muscle weakness (generalized) ? ?ONSET DATE: September 2022 ? ?SUBJECTIVE:  ? ?SUBJECTIVE STATEMENT: ?Pt states she felt better in a way but worse in a way also after prior Rx.  Pt states she has pain her entire 12 hr work shift.  Pt reports she has 8/10 pain at work though she states that last night was an easy night.  Pt states her pain is  miserable during work.  Pt states she probably hurt the worst last night at work.  Pt has increased pain with sitting and ambulation at work including walking fast at work.  Pt states she feels much better at home than work though does have pain sitting at home.  Pt states the chairs are not ergonomic at work.  Pt states she performs about 5 of her home exercises which takes up 45 minutes.   ? ?Pt has L lateral hip pain which can travel down lateral thigh to knee when it's worse and on really bad days can travel to L ankle.  Pt thinks it is nerve pain, though feels different than her other nerve pain from her neck.  Pt states she has deep gluteal pain and it's throbbing pain.  ? ?  ? ?PERTINENT HISTORY: ?Hypothyroidism due to Hashimoto's thyroiditis, Anxiety, Depression, and C4-5 discectomy fusion ? ?PAIN:  ?Are you having pain?Yes  ?NPRS scale: 1/10 current ?Pain location: L lateral hip pain; coccyx/sacrum.  Pain can travel down lateral L LE ?Aggravating factors: prolonged sitting, sitting with feet in the seat and hips ER, lying on L side, work activities ?Relieving factors: Using a pillow b/w knees and lying on R side, walking ? ?PRECAUTIONS: Other: Recent sickness with long-lasting GI sx's when eating on [place]  [place] the [place]  ? ?WEIGHT BEARING RESTRICTIONS No ? ?FALLS:  ?Has patient fallen in last 6 months? No ? ? ? 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 ? ?PLOF: Independent; Pt was able to perform her normal functional mobility skills and work activities without L hip pain or limitation.  ? ?PATIENT GOALS to be pain free completely ? ? ?OBJECTIVE:  ? ? ?TODAY'S TREATMENT: ? ?Manual:  STM with trigger point release and rolling to gluteal and ITB in S/L'ing with pillow b/w knees ? ? ?Therapeutic Exercise:   ?Reviewed response to prior Rx, HEP compliance, current function.   ?Reviewed HEP ?Pt performed  ?Bridge with RTB around thighs 2x15 with cuing for technique   ?Side lying clamshell 2x10  ?Lumbar rotation x20  ?Supine march with switch 2x15  ?Piriformis stretch 2x30 sec on L and x 30 sec on R ?Supine figure 4 stretch 3x30 sec on L ?See below for pt education ?  ? ? ?PATIENT EDUCATION:  ?Education details:  PT informed pt to see MD due to high levels of pain.  PT answered pt's questions.  PT educated pt with not sitting for long periods of time and to alternate sitting/standing at work.  Correct posture in sitting.  POC, rationale of exercises, dx, relevant anatomy.    ?Person educated: Patient ?Education method: Medical illustrator, verbal cues ?Education comprehension: verbalized understanding and needs further education ? ? ?HOME EXERCISE PROGRAM: ?Access Code: [email or web address] ?URL: [email or web address] ?Date: 07/11/2021 ?Prepared by: Lorayne Bender ? ?Exercises ?Supine Lower Trunk Rotation - 1 x daily - 7 x weekly - 3 sets - 10 reps ?Theracane Over Shoulder - 1 x daily - 7 x weekly - 3 sets - 10 reps ?Standing Glute Med Mobilization with Small Ball on Wall - 1 x daily - 7 x weekly - 3 sets - 10 reps ?Supine Piriformis Stretch with Foot on Ground - 1 x daily - 7 x weekly - 3 sets - 3 reps - 20sec hold ?Hooklying Clamshell with Resistance - 1 x daily - 7 x weekly - 3 sets - 10 reps ?Clamshell - 1 x daily - 7 x weekly - 3 sets - 10 reps ?Supine Bridge - 1 x daily - 7 x weekly - 3 sets - 10 reps ?Supine Bridge with Resistance Band - 1 x daily - 7 x weekly - 3 sets - 10 reps ?Shoulder extension with resistance - Neutral - 1 x daily - 7 x weekly - 3 sets - 10 reps ?Scapular Retraction with Resistance - 1 x daily - 7 x weekly - 3 sets - 10 reps ? ? ?ASSESSMENT: ? ?CLINICAL IMPRESSION:  ?Pt presents to Rx c/o'ing of significant pain the previous night at work.  She continues to have significant pain at work with activities including walking and sitting.  She reports work not having Ecologist.  PT spent time answering pt's questions and  educating pt concerning posture, activity, pain, and relevant anatomy.  Pt states she hurt worse during the STW today than normal especially with ITB though no increased pain after performing STW.  She states her leg feels weak after STW.  Pt reports feeling a good stretch with figure 4 stretch.  Pt states it feels a little looser after Rx and feels a little better at the end of Rx.  ? ? ?OBJECTIVE IMPAIRMENTS decreased activity tolerance, decreased mobility, decreased strength, hypomobility, impaired flexibility, and pain.  ? ?ACTIVITY  LIMITATIONS occupation.  ? ?PERSONAL FACTORS Co-morbidity 1: Hashimoto's thyroiditis are also affecting patient's functional outcome.  ? ? ?REHAB POTENTIAL: Good ? ?CLINICAL DECISION MAKING: Stable/uncomplicated ? ?EVALUATION COMPLEXITY: Low ? ? ?GOALS: ? ? ?SHORT TERM GOALS: ? ?STG Name Target Date Goal status  ?1 Pt will be independent and compliant with HEP for improved pain, strength, flexibility, and function.   ?Baseline:  08/09/2021 INITIAL  ?2 Pt's worst pain will be less than 7/10 for improved mobility and performance of work activities.  ?Baseline:  08/09/2021 INITIAL  ? ?LONG TERM GOALS:  ? ?LTG Name Target Date Goal status  ?1 Pt will be able to perform her normal work activities without significant pain or limitation.  ?Baseline: 08/30/2021 INITIAL  ?2 Pt will demo 5/5 L hip flex, abd, and ER strength for improved performance of and tolerance with functional mobility and work activities.  ?Baseline: 08/30/2021 INITIAL  ?3 Pt will report at least a 70% improvement in pain with sitting activities.  ?Baseline: 08/30/2021 INITIAL  ?     ?     ?     ?     ? ?PLAN: ?PT FREQUENCY: 2x/week ? ?PT DURATION: 6 weeks ? ?PLANNED INTERVENTIONS: Therapeutic exercises, Therapeutic activity, Neuro Muscular re-education, Gait training, Patient/Family education, Joint mobilization, Stair training, Aquatic Therapy, Dry Needling, Electrical stimulation, Cryotherapy, Moist heat, Taping, Ultrasound,  and Manual therapy ? ?PLAN FOR NEXT SESSION: Cont with hip/glute strengthening and piriformis flexibility.  Cont with manual techniques including STM and rolling to glute and ITB.  PT informed pt to see MD concerning

## 2021-07-19 ENCOUNTER — Encounter (HOSPITAL_BASED_OUTPATIENT_CLINIC_OR_DEPARTMENT_OTHER): Payer: Self-pay | Admitting: Physical Therapy

## 2021-07-20 ENCOUNTER — Other Ambulatory Visit: Payer: Self-pay

## 2021-07-20 ENCOUNTER — Encounter (HOSPITAL_BASED_OUTPATIENT_CLINIC_OR_DEPARTMENT_OTHER): Payer: Self-pay | Admitting: Physical Therapy

## 2021-07-20 ENCOUNTER — Ambulatory Visit (HOSPITAL_BASED_OUTPATIENT_CLINIC_OR_DEPARTMENT_OTHER): Payer: 59 | Admitting: Physical Therapy

## 2021-07-20 ENCOUNTER — Other Ambulatory Visit (HOSPITAL_BASED_OUTPATIENT_CLINIC_OR_DEPARTMENT_OTHER): Payer: Self-pay

## 2021-07-20 DIAGNOSIS — M25552 Pain in left hip: Secondary | ICD-10-CM | POA: Diagnosis not present

## 2021-07-20 DIAGNOSIS — M533 Sacrococcygeal disorders, not elsewhere classified: Secondary | ICD-10-CM

## 2021-07-20 DIAGNOSIS — M6281 Muscle weakness (generalized): Secondary | ICD-10-CM

## 2021-07-20 DIAGNOSIS — M2559 Pain in other specified joint: Secondary | ICD-10-CM | POA: Diagnosis not present

## 2021-07-20 NOTE — Therapy (Addendum)
OUTPATIENT PHYSICAL THERAPY LOWER EXTREMITY Treatment/Discharge    Patient Name: Chelsea Hill MRN: [id number] DOB:1976/02/11, 46 y.o., female Today's Date: 07/05/2021   PT End of Session - 07/20/21 2118     Visit Number 8    Number of Visits 12    Date for PT Re-Evaluation 08/02/21    Authorization Type [facility]    PT Start Time [phone]   Patient 10 min late   PT Stop Time 0930    PT Time Calculation (min) 34 min    Activity Tolerance Patient tolerated treatment well    Behavior During Therapy [facility] for tasks assessed/performed                Past Medical History:  Diagnosis Date   Alcohol addiction ([place])    Anxiety    Depression    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[place])    Hyperlipidemia    Thyroid disease    Past Surgical History:  Procedure Laterality Date   ABDOMINAL HYSTERECTOMY     SPINE SURGERY     Patient Active Problem List   Diagnosis Date Noted   Acute bilateral low back pain with bilateral sciatica 06/07/2021   Greater trochanteric pain syndrome of left lower extremity 05/25/2021   Coccyx pain 05/25/2021   COVID-19 01/11/2021   Joint pain 11/25/2020   Rash in adult 11/25/2020   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 She described it as close to labor pain. At this time she has not responded to PT. Her MRI came back clear for her lower back. She has a sigificant  increase in pain when she goes to work.   Pt has L lateral hip pain which can travel down lateral thigh to knee when it's worse and on really bad days can travel to L ankle.  Pt thinks it is nerve pain, though feels different than her other nerve pain from her neck.  Pt states she has deep gluteal pain and it's throbbing pain.      PERTINENT HISTORY: Hypothyroidism due to Hashimoto's thyroiditis, Anxiety, Depression, and C4-5 discectomy fusion  PAIN:  Are you having pain?Yes  NPRS scale: 2/10 current reached a 1010 last time she worked.  Pain location: L lateral hip pain; coccyx/sacrum.  Pain can travel down lateral L LE Aggravating factors: prolonged sitting, sitting with feet in the seat and hips ER, lying on L side, work activities Relieving factors: Using a pillow b/w knees and lying on R side, walking  PRECAUTIONS: Other: Recent sickness with long-lasting GI sx's when eating on her vacation out of the country   [place] No  FALLS:  Has patient fallen in last 6 months? 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  PLOF: Independent; Pt was able to perform her normal functional mobility skills and work activities without L hip pain or limitation.   PATIENT GOALS to be  pain free completely   OBJECTIVE:    TODAY'S TREATMENT: 3/22 Trigger point release to piriformis; LAD to left leg   Clamshell x20 red  Bridge x20 red  LTR x20  Supine march x20      Last visit: Manual:  STM with trigger point release and rolling to gluteal and ITB in S/L'ing with pillow b/w knees   Therapeutic Exercise:   Reviewed response to prior Rx, HEP compliance, current function.   Reviewed HEP Pt performed  Bridge with RTB around thighs 2x15 with cuing for technique  Side lying clamshell 2x10  Lumbar rotation x20  Supine march with switch 2x15  Piriformis stretch 2x30 sec on L and x 30 sec on R Supine figure 4 stretch 3x30 sec on  L See below for pt education     PATIENT EDUCATION:  Education details:  PT informed pt to see MD due to high levels of pain.  PT answered pt's questions.  PT educated pt with not sitting for long periods of time and to alternate sitting/standing at work.  Correct posture in sitting.  POC, rationale of exercises, dx, relevant anatomy.    Person educated: Patient Education method: Customer service manager, verbal cues Education comprehension: verbalized understanding and needs further education   HOME EXERCISE PROGRAM: Access Code: [email or web address] URL: [email or web address] Date: 07/11/2021 Prepared by: Carolyne Littles  Exercises Supine Lower Trunk Rotation - 1 x daily - 7 x weekly - 3 sets - 10 reps Theracane Over Shoulder - 1 x daily - 7 x weekly - 3 sets - 10 reps Standing Glute Med Mobilization with Small Ball on Wall - 1 x daily - 7 x weekly - 3 sets - 10 reps Supine Piriformis Stretch with Foot on Ground - 1 x daily - 7 x weekly - 3 sets - 3 reps - 20sec hold Hooklying Clamshell with Resistance - 1 x daily - 7 x weekly - 3 sets - 10 reps Clamshell - 1 x daily - 7 x weekly - 3 sets - 10 reps Supine Bridge - 1 x daily - 7 x weekly - 3 sets - 10 reps Supine Bridge with Resistance Band - 1 x daily - 7 x weekly - 3 sets - 10 reps Shoulder extension with resistance - Neutral - 1 x daily - 7 x weekly - 3 sets - 10 reps Scapular Retraction with Resistance - 1 x daily - 7 x weekly - 3 sets - 10 reps   ASSESSMENT:  CLINICAL IMPRESSION:   At this point the patients symptoms are getting worse. She has had increased pain at work. She has been working on her exercises and stretches with no benefit. We will send her back to her MD for further follow up. She worked on light exercises. She was encouraged to continue those exercises at home.    OBJECTIVE IMPAIRMENTS decreased activity tolerance, decreased mobility, decreased strength, hypomobility, impaired flexibility, and pain.    ACTIVITY LIMITATIONS occupation.   PERSONAL FACTORS Co-morbidity 1: Hashimoto's thyroiditis are also affecting patient's functional outcome.    REHAB POTENTIAL: Good  CLINICAL DECISION MAKING: Stable/uncomplicated  EVALUATION COMPLEXITY: Low   GOALS:   SHORT TERM GOALS:  STG Name Target Date Goal status  1 Pt will be independent and compliant with HEP for improved pain, strength, flexibility, and function.   Baseline:  08/10/2021 INITIAL  2 Pt's worst pain will be less than 7/10 for improved mobility and performance of work activities.  Baseline:  08/10/2021 INITIAL   LONG  TERM GOALS:   LTG Name Target Date Goal status  1 Pt will be able to perform her normal work activities without significant pain or limitation.  Baseline: 08/31/2021 INITIAL  2 Pt will demo 5/5 L hip flex, abd, and ER strength for improved performance of and tolerance with functional mobility and work activities.  Baseline: 08/31/2021 INITIAL  3 Pt will report at least a 70% improvement in pain with sitting activities.  Baseline: 08/31/2021 INITIAL                       PLAN: PT FREQUENCY: 2x/week  PT DURATION: 6 weeks  PLANNED INTERVENTIONS: Therapeutic exercises, Therapeutic activity, Neuro Muscular re-education, Gait training, Patient/Family education, Joint mobilization, Stair training, Aquatic Therapy, Dry Needling, Electrical stimulation, Cryotherapy, Moist heat, Taping, Ultrasound, and Manual therapy  PLAN FOR NEXT SESSION: Cont with hip/glute strengthening and piriformis flexibility.  Cont with manual techniques including STM and rolling to glute and ITB.  PT informed pt to see MD concerning her high levels of pain.   PHYSICAL THERAPY DISCHARGE SUMMARY  Visits from Start of Care: 8  Current functional level related to goals / functional outcomes: Sent back to MD    Remaining deficits: Sent back to MD    Education / Equipment: HEP    Patient agrees to discharge. Patient goals were not  met. Patient is being discharged due to not returning since the last visit.   Burnis Medin PT, DPT 07/20/21 9:24 PM

## 2021-07-20 NOTE — Progress Notes (Signed)
Amb

## 2021-07-25 ENCOUNTER — Ambulatory Visit (INDEPENDENT_AMBULATORY_CARE_PROVIDER_SITE_OTHER): Payer: 59 | Admitting: Orthopaedic Surgery

## 2021-07-25 ENCOUNTER — Ambulatory Visit (HOSPITAL_BASED_OUTPATIENT_CLINIC_OR_DEPARTMENT_OTHER): Payer: 59 | Admitting: Nurse Practitioner

## 2021-07-25 ENCOUNTER — Other Ambulatory Visit: Payer: Self-pay

## 2021-07-25 DIAGNOSIS — M67959 Unspecified disorder of synovium and tendon, unspecified thigh: Secondary | ICD-10-CM

## 2021-07-25 DIAGNOSIS — Z96642 Presence of left artificial hip joint: Secondary | ICD-10-CM

## 2021-07-25 MED ORDER — LIDOCAINE HCL 1 % IJ SOLN
4.0000 mL | INTRAMUSCULAR | Status: AC | PRN
  Administered 2021-07-25: 4 mL

## 2021-07-25 MED ORDER — TRIAMCINOLONE ACETONIDE 40 MG/ML IJ SUSP
80.0000 mg | INTRAMUSCULAR | Status: AC | PRN
  Administered 2021-07-25: 80 mg via INTRA_ARTICULAR

## 2021-07-25 NOTE — Progress Notes (Signed)
? ?                            ? ? ?Chief Complaint: left hip pain ?  ? ? ?History of Present Illness:  ? ? ?Chelsea Hill is a 46 y.o. female presents with left hip pain which is now been ongoing for approximately a year.  She does not have any specific injury or incident.  She works as a Marine scientist in the Engineer, maintenance at [employer].  She states that she not have any specific injury or trauma.  She did notice that she began having pain with laying directly on the side.  This progressively got worse and began to bother her even during the day.  She states that the pain is worse when sitting for longer periods and then when she goes to standing.  She is limited in walking any significant distances over several blocks.  She has previously been seen by Dr. Betti Cruz who referred her for physical therapy.  They did work on specific strengthening of the left hip.  She was referred here for additional management as she has significant weakness about the left compared to the right that was not improving with a dedicated therapy program for several months.  She denies any previous injections.  She does enjoy being active and traveling such as on a recent trip to [place] although again this has been limited by her hip. ? ? ? ?Surgical History:   ?She has a history of a C5-C6 spinal fusion done nearly a decade prior ? ?PMH/PSH/Family History/Social History/Meds/Allergies:   ? ?Past Medical History:  ?Diagnosis Date  ? Alcohol addiction ([place])   ? Anxiety   ? Depression   ? Drug addiction in remission [facility])   ? Hyperlipidemia   ? Thyroid disease   ? ?Past Surgical History:  ?Procedure Laterality Date  ? ABDOMINAL HYSTERECTOMY    ? SPINE SURGERY    ? ?Social History  ? ?Socioeconomic History  ? Marital status: Married  ?  Spouse name: Not on file  ? Number of children: Not on file  ? Years of education: Not on file  ? Highest education level: Not on file  ?Occupational History  ? Not on file  ?Tobacco Use  ? Smoking status: Every  Day  ?  Packs/day: 1.00  ?  Years: 30.00  ?  Pack years: 30.00  ?  Types: Cigarettes  ? Smokeless tobacco: Never  ? Tobacco comments:  ?  Patient reports that she would like to quit smoking if she can get her autoimmune symptoms under control  ?Vaping Use  ? Vaping Use: Never used  ?Substance and Sexual Activity  ? Alcohol use: Not Currently  ? Drug use: Not Currently  ? Sexual activity: Yes  ?  Birth control/protection: Surgical  ?Other Topics Concern  ? Not on file  ?Social History Narrative  ? Not on file  ? ?Social Determinants of Health  ? ?Financial Resource Strain: Not on file  ?Food Insecurity: Not on file  ?Transportation Needs: Not on file  ?Physical Activity: Not on file  ?Stress: Not on file  ?Social Connections: Not on file  ? ?Family History  ?Problem Relation Age of Onset  ? Hypertension Father   ? Heart disease Father   ? Breast cancer Maternal Aunt   ? ?Allergies  ?Allergen Reactions  ? Venlafaxine   ? ?Current Outpatient Medications  ?Medication Sig Dispense  Refill  ? FLUoxetine (PROZAC) 20 MG capsule Take 1 capsule (20 mg total) by mouth daily. 90 capsule 3  ? ibuprofen (ADVIL) 800 MG tablet Take 1 tablet (800 mg total) by mouth every 8 (eight) hours as needed. 30 tablet 6  ? levothyroxine (SYNTHROID) 25 MCG tablet Take 1 tablet (25 mcg total) by mouth daily before breakfast. 90 tablet 3  ? ondansetron (ZOFRAN-ODT) 8 MG disintegrating tablet Take 1 tablet (8 mg total) by mouth every 8 (eight) hours as needed for nausea. 30 tablet 3  ? propranolol (INDERAL) 10 MG tablet Take 10 mg by mouth 3 (three) times daily.    ? ?No current facility-administered medications for this visit.  ? ?No results found. ? ?Review of Systems:   ?A ROS was performed including pertinent positives and negatives as documented in the HPI. ? ?Physical Exam :   ?Constitutional: NAD and appears stated age ?Neurological: Alert and oriented ?Psych: Appropriate affect and cooperative ?There were no vitals taken for this visit.   ? ?Comprehensive Musculoskeletal Exam:   ? ?Inspection Right Left  ?Skin No atrophy or gross abnormalities appreciated No atrophy or gross abnormalities appreciated  ?Palpation    ?Tenderness None None  ?Crepitus None None  ?Range of Motion    ?Flexion (passive) 120 120  ?Extension 30 30  ?IR 30 30  ?ER 50 50  ?Strength    ?Flexion  5/5 5/5  ?Extension 5/5 5/5  ?Special Tests    ?FABER Negative Negative  ?FADIR Negative With pain that refers to the lateral hip  ?ER Lag/Capsular Insufficiency Negative Negative  ?Instability Negative Negative  ?Sacroiliac pain Negative  Negative   ?Instability    ?Generalized Laxity No No  ?Neurologic    ?sciatic, femoral, obturator nerves intact to light sensation  ?Vascular/Lymphatic    ?DP pulse 2+ 2+  ?Lumbar Exam    ?Patient has symmetric lumbar range of motion with negative pain referral to hip  ? ?She has significant weakness with standing leg test on the left side.  This resulted in Trendelenburg type gait.  She has weakness with resisted abduction of the left as well as the right but much worse on the left ? ?Imaging:   ?Xray (x-ray AP pelvis, 2 views left hip): ?Normal ? ?MRI (lumbar spine): ?Normal ? ?I personally reviewed and interpreted the radiographs. ? ? 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  Based on her physical examination I am concerned specifically about a gluteus medius tear.  She does have significant weakness on this side with evidence of Trendelenburg posture.  She does currently smoke approximately a pack a day which I have advised can occasionally contribute to degenerative type tearing.  Given the fact that she has tried a Arboriculturist program for up to several months and is making improvements in her hip strength, I do believe an MRI is indicated in order to assess for an underlying tear that might need to be intervened  upon.  In the meantime I have also discussed with her additional nonoperative treatments including steroid injections.  I do believe a steroid injection will be very helpful both therapeutically and diagnostically.  She would like to pursue this today.  I will see her back following the results of her MRI and we will discuss further. ? ?Plan :   ? ?-Plan for MRI left hip ? ?I believe  that advance imaging in the form of an MRI is indicated for the following reasons: ?-Xrays images were obtained and not diagnostic ?-The patient has failed treatment modalities including rest, activity restriction, anti-inflammatories, physical therapy for several months ?-The following worrisome symptoms are present on history and exam: Positive Trendelenburg and weakness with resisted abduction ? ? ? ? ?-Left gluteus medius ultrasound-guided injection performed today after verbal consent obtained ? ?Procedure Note ? ?Patient: Chelsea Hill             ?Date of Birth: 04-Aug-1975           ?MRN: [id number]             ?Visit Date: 07/25/2021 ? ?Procedures: ?Visit Diagnoses: No diagnosis found. ? ?Large Joint Inj: L greater trochanter on 07/25/2021 8:54 AM ?Indications: pain ?Details: 22 G 3.5 in needle, ultrasound-guided anterolateral approach ? ?Arthrogram: No ? ?Medications: 4 mL lidocaine 1 %; 80 mg triamcinolone acetonide 40 MG/ML ?Outcome: tolerated well, no immediate complications ?Procedure, treatment alternatives, risks and benefits explained, specific risks discussed. Consent was given by the patient. Immediately prior to procedure a time out was called to verify the correct patient, procedure, equipment, support staff and site/side marked as required. Patient was prepped and draped in the usual sterile fashion.  ? ? ? ? ? ? ? ? ?I personally saw and evaluated the patient, and participated in the management and treatment plan. ? ?Vanetta Mulders, MD ?Attending Physician, Orthopedic Surgery ? ?This document was dictated using  Systems analyst. A reasonable attempt at proof reading has been made to minimize errors. ?

## 2021-08-05 ENCOUNTER — Other Ambulatory Visit: Payer: 59

## 2021-08-11 ENCOUNTER — Ambulatory Visit
Admission: RE | Admit: 2021-08-11 | Discharge: 2021-08-11 | Disposition: A | Discharge status: Home / Self Care | Payer: 59 | Source: Ambulatory Visit | Attending: Orthopaedic Surgery | Admitting: Orthopaedic Surgery

## 2021-08-11 DIAGNOSIS — M67959 Unspecified disorder of synovium and tendon, unspecified thigh: Secondary | ICD-10-CM

## 2021-08-11 DIAGNOSIS — S76312A Strain of muscle, fascia and tendon of the posterior muscle group at thigh level, left thigh, initial encounter: Secondary | ICD-10-CM | POA: Diagnosis not present

## 2021-08-11 DIAGNOSIS — Z96642 Presence of left artificial hip joint: Secondary | ICD-10-CM

## 2021-08-22 ENCOUNTER — Ambulatory Visit (HOSPITAL_BASED_OUTPATIENT_CLINIC_OR_DEPARTMENT_OTHER): Payer: 59 | Admitting: Orthopaedic Surgery

## 2021-09-20 ENCOUNTER — Ambulatory Visit (HOSPITAL_BASED_OUTPATIENT_CLINIC_OR_DEPARTMENT_OTHER): Payer: 59 | Admitting: Nurse Practitioner

## 2021-09-20 VITALS — BP 106/84 | HR 72 | Ht 63.0 in | Wt 115.6 lb

## 2021-09-20 DIAGNOSIS — R21 Rash and other nonspecific skin eruption: Secondary | ICD-10-CM

## 2021-09-20 DIAGNOSIS — Z7689 Persons encountering health services in other specified circumstances: Secondary | ICD-10-CM

## 2021-09-20 MED ORDER — PREDNISONE 10 MG PO TABS: ORAL_TABLET | ORAL | 2 refills | Status: DC

## 2021-09-21 ENCOUNTER — Other Ambulatory Visit (HOSPITAL_BASED_OUTPATIENT_CLINIC_OR_DEPARTMENT_OTHER): Payer: Self-pay | Admitting: Nurse Practitioner

## 2021-09-21 ENCOUNTER — Encounter (HOSPITAL_BASED_OUTPATIENT_CLINIC_OR_DEPARTMENT_OTHER): Payer: Self-pay | Admitting: Nurse Practitioner

## 2021-09-21 DIAGNOSIS — Z7689 Persons encountering health services in other specified circumstances: Secondary | ICD-10-CM | POA: Diagnosis not present

## 2021-09-21 DIAGNOSIS — R21 Rash and other nonspecific skin eruption: Secondary | ICD-10-CM | POA: Diagnosis not present

## 2021-09-21 LAB — CBC WITH DIFFERENTIAL/PLATELET
Basophils Absolute: 0.1 10*3/uL (ref 0.0–0.2)
Basos: 1 %
EOS (ABSOLUTE): 0.2 10*3/uL (ref 0.0–0.4)
Eos: 3 %
Hematocrit: 40.9 % (ref 34.0–46.6)
Hemoglobin: 13.8 g/dL (ref 11.1–15.9)
Immature Grans (Abs): 0 10*3/uL (ref 0.0–0.1)
Immature Granulocytes: 0 %
Lymphocytes Absolute: 2.9 10*3/uL (ref 0.7–3.1)
Lymphs: 35 %
MCH: 31.2 pg (ref 26.6–33.0)
MCHC: 33.7 g/dL (ref 31.5–35.7)
MCV: 93 fL (ref 79–97)
Monocytes Absolute: 0.8 10*3/uL (ref 0.1–0.9)
Monocytes: 10 %
Neutrophils Absolute: 4.2 10*3/uL (ref 1.4–7.0)
Neutrophils: 51 %
Platelets: 330 10*3/uL (ref 150–450)
RBC: 4.42 x10E6/uL (ref 3.77–5.28)
RDW: 12.7 % (ref 11.7–15.4)
WBC: 8.3 10*3/uL (ref 3.4–10.8)

## 2021-09-21 MED ORDER — PREDNISONE 10 MG PO TABS: ORAL_TABLET | ORAL | 2 refills | Status: DC

## 2021-09-21 NOTE — Progress Notes (Signed)
Tollie Eth, DNP, AGNP-c Primary Care [facility] Sports Medicine [address]  [address] [place], [place] [address] [phone]  Subjective:   Chelsea Hill is a 46 y.o. female presents to day for concerns with recurrence of rash. Chelsea Hill has had issues with recurrent rash around her eyelids and mouth as well as on her trunk for quite some time now.  Previously she was started on a long-term steroid taper and then a maintenance dose of 5 mg of prednisone daily which completely resolved the issue for several months.  She decided to stop the prednisone and went approximately 2 to 3 weeks with no symptoms however then started to experience recurrence of the symptoms suddenly. She reports on 5/2 she noticed that her eyelids felt swollen and heavy.  She also noticed her lips were burning and tingling.  She describes this sensation as itchy, stinging, "like a wind burned" and "chapped".  Later that same day she noticed she had started to develop a scattered erythematous rash from her upper arms covering the entire trunk to the middle of her upper thighs.  She reports that she wanted to see what her body would do without taking any medication and has let this rash go on now for approximately 3 weeks.  She tells me that the rash on her trunk seems to be improving somewhat however the rash on her eyes and around her mouth is still severe in nature.  Chelsea Hill expresses extreme frustration of her symptoms and the recurrence with no known cause.  She tells me that she has had a complete work-up in the past with multiple tests and no significant findings. She tells me she is "absolutely miserable" she also reports she "can barely function". She endorses energy depletion increased stress, fatigue, widespread muscle pain, and depression symptoms related to ongoing health issues that are unresolved. She tells me that she feels the rash is always there it just goes dormant for a while.  She  reports this all started when she quit smoking approximately 4 years ago.  She tells me when it started it was a quick sudden onset.  She reports that it worsened after she had a COVID-vaccine.  She has not had a biopsy of any of this in the past and has not seen dermatology or allergy for this.  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  She would like to have a referral today to dermatology and allergy for the rash.  To see if they are able to determine the cause of this.  She would also like to restart her prednisone.  PMH, Medications, and Allergies reviewed and updated in chart.   ROS negative except for what is listed in HPI. Objective:  BP 106/84   Pulse 72   Ht 5\' 3"  (1.6 m)   Wt 115 lb 9.6 oz (52.4 kg)   SpO2 100%   BMI 20.48 kg/m  Physical Exam Vitals and nursing note reviewed.  Constitutional:      Appearance: Normal appearance.  HENT:     Head: Normocephalic.     Nose: Nose normal.     Mouth/Throat:     Mouth: Mucous membranes are moist.     Pharynx: Oropharynx is clear.  Eyes:     Extraocular Movements: Extraocular movements intact.     Conjunctiva/sclera: Conjunctivae normal.     Pupils: Pupils are equal, round, and reactive to light.  Neck:     Vascular:  No carotid bruit.  Cardiovascular:     Rate and Rhythm: Normal rate and regular rhythm.     Pulses: Normal pulses.     Heart sounds: Normal heart sounds.  Pulmonary:     Effort: Pulmonary effort is normal.     Breath sounds: Normal breath sounds.  Abdominal:     General: Bowel sounds are normal.     Palpations: Abdomen is soft.  Musculoskeletal:        General: Tenderness present.     Cervical back: Normal range of motion.     Right lower leg: No edema.     Left lower leg: No edema.  Lymphadenopathy:     Cervical: No cervical adenopathy.  Skin:    General: Skin is warm and dry.     Capillary Refill: Capillary refill takes less than 2 seconds.     Findings:  Erythema and rash present. Rash is macular and scaling.     Comments: Eyelids show scaling rash present with no signs of drainage.  Mild edema noted under the eyes. Perioral area appears dry.  Appearance of fissuring present without complete crack through the epidermal layer.  No signs of drainage. Erythematous macular rash scattered across upper extremities, posterior and anterior trunk, and upper thighs.  Discrete approximate 0.5 to 1 cm macules are present.  No signs of excoriation, drainage, inflammation, infection.  Neurological:     General: No focal deficit present.     Mental Status: She is alert and oriented to person, place, and time.     Sensory: No sensory deficit.     Motor: No weakness.     Gait: Gait normal.     Deep Tendon Reflexes: Reflexes normal.          Assessment & Plan:   Problem List Items Addressed This Visit     Rash - Primary    Chronic recurrent rash of unknown etiology.  Previous resolution of symptoms with low-dose prednisone and Claritin as daily regimen following long prednisone taper. Patient has had significant [place] the [place] with [place] confirmed diagnosis.  This is causing quite a bit of distress and frustration and the patient and she is eager to find a cause and resolution for this. We will send a referral today to dermatology and the allergist for further evaluation.  She may need a biopsy of one of the lesions for further evaluation.  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  Patient has previously been referred to rheumatology however all symptoms have resolved and she did not follow-up with this.  If unable to find calls with dermatology or allergy will consider referral to endocrinology for further evaluation.       Relevant Medications   predniSONE (DELTASONE) 10 MG tablet   Other Relevant Orders   CBC with Differential   Ambulatory referral to  Dermatology   Ambulatory referral to Allergy     Tollie Eth, DNP, AGNP-c 09/21/2021  9:59 PM

## 2021-09-21 NOTE — Assessment & Plan Note (Signed)
>>  ASSESSMENT AND PLAN FOR RASH WRITTEN ON 09/21/2021  9:59 PM BY Emmalou Hunger E, NP  Chronic recurrent rash of unknown etiology.  Previous resolution of symptoms with low-dose prednisone and Claritin as daily regimen following long prednisone taper. Patient has had significant work-[place] the past [place] with no confirmed diagnosis.  This is causing quite a bit of distress and frustration and the patient and she is eager to find a cause and resolution for this. We will send a referral today to dermatology and the allergist for further evaluation.  She may need a biopsy of one of the lesions for further evaluation.  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  Patient has previously been referred to rheumatology however all symptoms have resolved and she did not follow-up with this.  If unable to find calls with dermatology or allergy will consider referral to endocrinology for further evaluation.

## 2021-09-21 NOTE — Assessment & Plan Note (Signed)
Chronic recurrent rash of unknown etiology.  Previous resolution of symptoms with low-dose prednisone and Claritin as daily regimen following long prednisone taper. Patient has had significant work[place] the [place] with no confirmed diagnosis.  This is causing quite a bit of distress and frustration and the patient and she is eager to find a cause and resolution for this. We will send a referral today to dermatology and the allergist for further evaluation.  She may need a biopsy of one of the lesions for further evaluation.  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  Patient has previously been referred to rheumatology however all symptoms have resolved and she did not follow-up with this.  If unable to find calls with dermatology or allergy will consider referral to endocrinology for further evaluation.

## 2021-10-25 ENCOUNTER — Other Ambulatory Visit (HOSPITAL_BASED_OUTPATIENT_CLINIC_OR_DEPARTMENT_OTHER): Payer: Self-pay | Admitting: Nurse Practitioner

## 2021-10-25 DIAGNOSIS — E038 Other specified hypothyroidism: Secondary | ICD-10-CM

## 2021-10-27 ENCOUNTER — Encounter (HOSPITAL_BASED_OUTPATIENT_CLINIC_OR_DEPARTMENT_OTHER): Payer: Self-pay | Admitting: Physical Therapy

## 2021-11-07 ENCOUNTER — Encounter (HOSPITAL_BASED_OUTPATIENT_CLINIC_OR_DEPARTMENT_OTHER): Payer: Self-pay | Admitting: Nurse Practitioner

## 2021-11-07 ENCOUNTER — Other Ambulatory Visit (HOSPITAL_BASED_OUTPATIENT_CLINIC_OR_DEPARTMENT_OTHER): Payer: Self-pay | Admitting: Nurse Practitioner

## 2021-11-07 DIAGNOSIS — E038 Other specified hypothyroidism: Secondary | ICD-10-CM

## 2021-11-08 ENCOUNTER — Other Ambulatory Visit (HOSPITAL_BASED_OUTPATIENT_CLINIC_OR_DEPARTMENT_OTHER): Payer: Self-pay

## 2021-11-08 DIAGNOSIS — E038 Other specified hypothyroidism: Secondary | ICD-10-CM

## 2021-11-08 MED ORDER — LEVOTHYROXINE SODIUM 25 MCG PO TABS: 25.0000 ug | ORAL_TABLET | Freq: Every day | ORAL | 3 refills | Status: DC

## 2022-01-19 ENCOUNTER — Other Ambulatory Visit: Payer: Self-pay | Admitting: Nurse Practitioner

## 2022-01-19 DIAGNOSIS — Z1231 Encounter for screening mammogram for malignant neoplasm of breast: Secondary | ICD-10-CM

## 2022-01-24 ENCOUNTER — Ambulatory Visit: Payer: 59 | Admitting: Allergy

## 2022-01-24 NOTE — Progress Notes (Deleted)
New Patient Note  RE: Chelsea Hill MRN: [id number] DOB: Sep 27, 1975 Date of Office Visit: 01/24/2022  Consult requested by: Orma Render, NP Primary care provider: Orma Render, NP  Chief Complaint: No chief complaint on file.  History of Present Illness: I had the pleasure of seeing Chelsea Hill for initial evaluation at the [facility] on 01/24/2022. She is a 46 y.o. female, who is referred here by Early, Coralee Pesa, NP for the evaluation of rash.  Rash started about *** ago. Mainly occurs on her ***. Describes them as ***. Individual rashes lasts about ***. No ecchymosis upon resolution. Associated symptoms include: ***.  Frequency of episodes: ***. Suspected triggers are ***. Denies any *** fevers, chills, changes in medications, foods, personal care products or recent infections. She has tried the following therapies: *** with *** benefit. Systemic steroids ***. Currently on ***.  Previous work up includes: ***. Previous history of rash/hives: ***. Patient is up to date with the following cancer screening tests: ***.  09/20/2021 PCP visit: "[name] has had issues with recurrent rash around her eyelids and mouth as well as on her trunk for quite some time now.  Previously she was started on a long-term steroid taper and then a maintenance dose of 5 mg of prednisone daily which completely resolved the issue for several months.  She decided to stop the prednisone and went approximately 2 to 3 weeks with no symptoms however then started to experience recurrence of the symptoms suddenly. She reports on 5/2 she noticed that her eyelids felt swollen and heavy.  She also noticed her lips were burning and tingling.  She describes this sensation as itchy, stinging, "like a wind burned" and "chapped".  Later that same day she noticed she had started to develop a scattered erythematous rash from her upper arms covering the entire trunk to the middle of her upper thighs.  She  reports that she wanted to see what her body would do without taking any medication and has let this rash go on now for approximately 3 weeks.  She tells me that the rash on her trunk seems to be improving somewhat however the rash on her eyes and around her mouth is still severe in nature.  Pansie expresses extreme frustration of her symptoms and the recurrence with no known cause.  She tells me that she has had a complete work-up in the past with multiple tests and no significant findings. She tells me she is "absolutely miserable" she also reports she "can barely function". She endorses energy depletion increased stress, fatigue, widespread muscle pain, and depression symptoms related to ongoing health issues that are unresolved. She tells me that she feels the rash is always there it just goes dormant for a while.  She reports this all started when she quit smoking approximately 4 years ago.  She tells me when it started it was a quick sudden onset.  She reports that it worsened after she had a COVID-vaccine.  She has not had a biopsy of any of this in the past and has not seen dermatology or allergy for this.  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  She would like to have a referral today to dermatology and allergy for the rash.  To see if they are able to determine the cause of this.  She would also like to restart her prednisone.   PMH, Medications, and Allergies reviewed and updated  in chart. "  Assessment and Plan: Chelsea Hill is a 46 y.o. female with: No problem-specific Assessment & Plan notes found for this encounter.  No follow-ups on file.  No orders of the defined types were placed in this encounter.  Lab Orders  No laboratory test(s) ordered today    Other allergy screening: Asthma: {Blank single:19197::"yes","no"} Rhino conjunctivitis: {Blank single:19197::"yes","no"} Food allergy: {Blank single:19197::"yes","no"} Medication allergy:  {Blank single:19197::"yes","no"} Hymenoptera allergy: {Blank single:19197::"yes","no"} Urticaria: {Blank single:19197::"yes","no"} Eczema:{Blank single:19197::"yes","no"} History of recurrent infections suggestive of immunodeficency: {Blank single:19197::"yes","no"}  Diagnostics: Spirometry:  Tracings reviewed. Her effort: {Blank single:19197::"Good reproducible efforts.","It was hard to get consistent efforts and there is a question as to whether this reflects a maximal maneuver.","Poor effort, data can not be interpreted."} FVC: ***L FEV1: ***L, ***% predicted FEV1/FVC ratio: ***% Interpretation: {Blank single:19197::"Spirometry consistent with mild obstructive disease","Spirometry consistent with moderate obstructive disease","Spirometry consistent with severe obstructive disease","Spirometry consistent with possible restrictive disease","Spirometry consistent with mixed obstructive and restrictive disease","Spirometry uninterpretable due to technique","Spirometry consistent with normal pattern","No overt abnormalities noted given today's efforts"}.  Please see scanned spirometry results for details.  Skin Testing: {Blank single:19197::"Select foods","Environmental allergy panel","Environmental allergy panel and select foods","Food allergy panel","None","Deferred due to recent antihistamines use"}. *** Results discussed with patient/family.   Past Medical History: Patient Active Problem List   Diagnosis Date Noted  . Acute bilateral low back pain with bilateral sciatica 06/07/2021  . Greater trochanteric pain syndrome of left lower extremity 05/25/2021  . Coccyx pain 05/25/2021  . COVID-19 01/11/2021  . Joint pain 11/25/2020  . Rash 11/25/2020  . 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  . Tobacco use disorder, continuous 11/25/2020  . Hypothyroidism due to Hashimoto's thyroiditis 10/07/2020  . Anxiety and depression 10/07/2020  . Eyelid dermatitis, allergic/contact 10/07/2020   Past  Medical History:  Diagnosis Date  . Alcohol addiction ([place])   . Anxiety   . Depression   . 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 Hyperlipidemia   . Thyroid disease    Past Surgical History: Past Surgical History:  Procedure Laterality Date  . ABDOMINAL HYSTERECTOMY    . SPINE SURGERY     Medication List:  Current Outpatient Medications  Medication Sig Dispense Refill  . FLUoxetine (PROZAC) 20 MG capsule Take 1 capsule (20 mg total) by mouth daily. 90 capsule 3  . ibuprofen (ADVIL) 800 MG tablet Take 1 tablet (800 mg total) by mouth every 8 (eight) hours as needed. 30 tablet 6  . levothyroxine (SYNTHROID) 25 MCG tablet Take 1 tablet (25 mcg total) by mouth daily before breakfast. 90 tablet 3  . ondansetron (ZOFRAN-ODT) 8 MG disintegrating tablet Take 1 tablet (8 mg total) by mouth every 8 (eight) hours as needed for nausea. (Patient not taking: Reported on 09/20/2021) 30 tablet 3  . predniSONE (DELTASONE) 10 MG tablet Take 5 tabs (50mg ) for 5 days then 4 tabs (40mg ) for 5 days then 3 tabs (30mg ) for 5 days then 2 tabs (20mg ) for 5 days then 1 tab (10mg ) for 5 days then 1/2 tab (5mg ) daily 90 tablet 2  . propranolol (INDERAL) 10 MG tablet Take 10 mg by mouth 3 (three) times daily.     No current facility-administered medications for this visit.   Allergies: Allergies  Allergen Reactions  . Venlafaxine    Social History: Social History   Socioeconomic History  . Marital status: Married    Spouse name: Not on file  . Number of children: Not on file  . Years of education: Not on file  .  Highest education level: Not on file  Occupational History  . Not on file  Tobacco Use  . Smoking status: Every Day    Packs/day: 1.00    Years: 30.00    Total pack years: 30.00    Types: Cigarettes  . Smokeless tobacco: Never  . Tobacco comments:    Patient reports that she would like to quit smoking if she can get her autoimmune symptoms under control  Vaping Use  . Vaping Use: Never  used  Substance and Sexual Activity  . Alcohol use: Not Currently  . Drug use: Not Currently  . Sexual activity: Yes    Birth control/protection: Surgical  Other Topics Concern  . Not on file  Social History Narrative  . Not on file   Social Determinants of Health   Financial Resource Strain: Not on file  Food Insecurity: Not on file  Transportation Needs: Not on file  Physical Activity: Not on file  Stress: Not on file  Social Connections: Not on file   Lives in a ***. Smoking: *** Occupation: ***  Environmental HistoryFreight forwarder in the house: Estate agent in the family room: {Blank single:19197::"yes","no"} Carpet in the bedroom: {Blank single:19197::"yes","no"} Heating: {Blank single:19197::"electric","gas","heat pump"} Cooling: {Blank single:19197::"central","window","heat pump"} Pet: {Blank single:19197::"yes ***","no"}  Family History: Family History  Problem Relation Age of Onset  . Hypertension Father   . Heart disease Father   . Breast cancer Maternal Aunt    Problem                               Relation Asthma                                   *** Eczema                                *** Food allergy                          *** Allergic rhino conjunctivitis     ***  Review of Systems  Constitutional:  Negative for appetite change, chills, fever and unexpected weight change.  HENT:  Negative for congestion and rhinorrhea.   Eyes:  Negative for itching.  Respiratory:  Negative for cough, chest tightness, shortness of breath and wheezing.   Cardiovascular:  Negative for chest pain.  Gastrointestinal:  Negative for abdominal pain.  Genitourinary:  Negative for difficulty urinating.  Skin:  Negative for rash.  Neurological:  Negative for headaches.   Objective: There were no vitals taken for this visit. There is no height or weight on file to calculate BMI. Physical Exam Vitals and nursing note reviewed.   Constitutional:      Appearance: Normal appearance. She is well-developed.  HENT:     Head: Normocephalic and atraumatic.     Right Ear: Tympanic membrane and external ear normal.     Left Ear: Tympanic membrane and external ear normal.     Nose: Nose normal.     Mouth/Throat:     Mouth: Mucous membranes are moist.     Pharynx: Oropharynx is clear.  Eyes:     Conjunctiva/sclera: Conjunctivae normal.  Cardiovascular:     Rate and Rhythm: Normal rate and regular rhythm.     Heart  sounds: Normal heart sounds. No murmur heard.    No friction rub. No gallop.  Pulmonary:     Effort: Pulmonary effort is normal.     Breath sounds: Normal breath sounds. No wheezing, rhonchi or rales.  Musculoskeletal:     Cervical back: Neck supple.  Skin:    General: Skin is warm.     Findings: No rash.  Neurological:     Mental Status: She is alert and oriented to person, place, and time.  Psychiatric:        Behavior: Behavior normal.  The plan was reviewed with the patient/family, and all questions/concerned were addressed.  It was my pleasure to see Laprincess today and participate in her care. Please feel free to contact me with any questions or concerns.  Sincerely,  Rexene Alberts, DO Allergy & Immunology  [facility] office: [place] office: [phone]

## 2022-02-09 ENCOUNTER — Ambulatory Visit (INDEPENDENT_AMBULATORY_CARE_PROVIDER_SITE_OTHER): Payer: 59 | Admitting: Nurse Practitioner

## 2022-02-09 ENCOUNTER — Encounter (HOSPITAL_BASED_OUTPATIENT_CLINIC_OR_DEPARTMENT_OTHER): Payer: Self-pay | Admitting: Nurse Practitioner

## 2022-02-09 VITALS — BP 110/75 | HR 83 | Ht 63.0 in | Wt 115.4 lb

## 2022-02-09 DIAGNOSIS — L659 Nonscarring hair loss, unspecified: Secondary | ICD-10-CM

## 2022-02-09 DIAGNOSIS — K134 Granuloma and granuloma-like lesions of oral mucosa: Secondary | ICD-10-CM

## 2022-02-09 DIAGNOSIS — E559 Vitamin D deficiency, unspecified: Secondary | ICD-10-CM

## 2022-02-09 DIAGNOSIS — R0602 Shortness of breath: Secondary | ICD-10-CM

## 2022-02-09 DIAGNOSIS — E038 Other specified hypothyroidism: Secondary | ICD-10-CM | POA: Diagnosis not present

## 2022-02-09 DIAGNOSIS — E063 Autoimmune thyroiditis: Secondary | ICD-10-CM

## 2022-02-09 DIAGNOSIS — R21 Rash and other nonspecific skin eruption: Secondary | ICD-10-CM

## 2022-02-09 DIAGNOSIS — R110 Nausea: Secondary | ICD-10-CM

## 2022-02-09 DIAGNOSIS — R232 Flushing: Secondary | ICD-10-CM | POA: Diagnosis not present

## 2022-02-09 DIAGNOSIS — F32A Depression, unspecified: Secondary | ICD-10-CM

## 2022-02-09 DIAGNOSIS — F419 Anxiety disorder, unspecified: Secondary | ICD-10-CM

## 2022-02-09 DIAGNOSIS — M2559 Pain in other specified joint: Secondary | ICD-10-CM

## 2022-02-09 MED ORDER — FLUOXETINE HCL 20 MG PO TABS: 20.0000 mg | ORAL_TABLET | Freq: Every day | ORAL | 3 refills | Status: DC

## 2022-02-09 MED ORDER — OMEPRAZOLE 40 MG PO CPDR: 40.0000 mg | DELAYED_RELEASE_CAPSULE | Freq: Two times a day (BID) | ORAL | 11 refills | Status: DC

## 2022-02-09 NOTE — Progress Notes (Signed)
Orma Render, DNP, AGNP-c [facility] [address]  [place], [place] [address] [phone]  Subjective:   Chelsea Hill is a 46 y.o. female presents to day for evaluation of: Acute Visit (Patient has fatigue and nauseated x 2 months. Been take husband Prilosec and its much better. Patient needs refill on propanolol. Sleeping a lot. She thinks the prozac capsule made it her sick she would like tablet. )  Multiple Concerns Discussed presents today with multiple health related concerns that have been ongoing for quite some time.  She tells me that she is extremely frustrated a clear diagnosis has not been made despite numerous symptoms and ongoing issues.  She tells me that the health concerns are significantly affecting her life and causing her to feel depressed.  She tells me that she typically focuses heavily on her symptoms unless she is working on a huge project.  She tells me that during times where she can keep her mind very busy she tends to do okay but as soon as the project is over her mind goes back to her health concerns.  She tells me that lately she has been spending a lot of time in bed due to her symptoms.  She tells me that she has been trying very hard to push her mind past her health concerns but at this time she is simply unable to do this.  She did stop her Prozac about 12 days ago as she thought it was making her sick however she does tell me that she did not notice any difference after stopping the medication. In the past she has had severe depressive symptoms with SI She endorses an increase in nausea, gas, and sour stomach for approximately the last 2 months.  She reports that it feels as though she constantly has morning sickness.  She tells me she has started omeprazole and this has helped some.  She tells me the nausea is worse when she is sitting up or standing up. 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 "Leaking of blood vessles" during a flair  Most symptoms start 5 years ago when she quit smoking.  She reports that she has started smoking again and this has been keeping everything at [place]. She would like to have an ultrasound of her neck and labs performed today  LVH, CKMB,   PMH, Medications, and Allergies reviewed and updated in chart as appropriate.   ROS negative except for what is listed in HPI. Objective:  BP 110/75   Pulse 83   Ht 5\' 3"  (1.6 m)   Wt 115 lb 6.4 oz (52.3 kg)   SpO2 98%   BMI 20.44 kg/m  Physical Exam  Vitals and nursing note reviewed.  Constitutional:      Appearance: She is not ill-appearing or toxic-appearing.  HENT:     Head: Normocephalic and atraumatic.  Eyes:     Extraocular Movements: Extraocular movements intact.     Conjunctiva/sclera: Conjunctivae normal.     Pupils: Pupils are equal, round, and reactive to light.  Neck:     Vascular: No carotid bruit.  Cardiovascular:     Rate and Rhythm: Normal rate and regular rhythm.     Pulses: Normal pulses.     Heart sounds: Normal heart sounds.  Pulmonary:     Effort: Pulmonary effort is normal.     Breath sounds: Normal  breath sounds.  Abdominal:     General: Bowel sounds are normal. There is no distension.     Palpations: Abdomen is soft.     Tenderness: There is no abdominal tenderness.  Musculoskeletal:     Cervical back: Normal range of motion. No tenderness.     Right lower leg: No edema.     Left lower leg: No edema.  Lymphadenopathy:     Cervical: No cervical adenopathy.  Skin:    General: Skin is warm and dry.     Capillary Refill: Capillary refill takes less than 2 seconds.  Neurological:     General: No focal deficit present.     Mental Status: She is alert and oriented to person, place, and time.     Sensory: No sensory deficit.     Motor: No weakness.     Coordination: Coordination normal.  Psychiatric:        Attention and Perception: Attention normal.        Mood and Affect: Mood is anxious. Affect is tearful.        Speech: Speech normal.        Behavior: Behavior is cooperative.        Thought Content: Thought content does not include homicidal or suicidal ideation.        Cognition and Memory: Cognition and memory normal.        Judgment: Judgment normal.           Assessment & Plan:   Problem List Items Addressed This Visit     Rash - Primary   Relevant Orders   CBC with Differential/Platelet (Completed)   Comprehensive metabolic panel (Completed)   B12 and Folate Panel (Completed)   Iron, TIBC and Ferritin Panel (Completed)   T4, free (Completed)   TSH (Completed)   VITAMIN D 25 Hydroxy (Vit-D Deficiency, Fractures) (Completed)   C-reactive protein (Completed)   Rheumatoid factor (Completed)   Magnesium (Completed)   US THYROID (Completed)   ANA+ENA+DNA/DS+Scl 70+SjoSSA/B (Completed)   Joint pain   Relevant Orders   CBC with Differential/Platelet (Completed)   Comprehensive metabolic panel (Completed)   B12 and Folate Panel (Completed)   Iron, TIBC and Ferritin Panel (Completed)   T4, free (Completed)   TSH (Completed)   VITAMIN D 25 Hydroxy (Vit-D  Deficiency, Fractures) (Completed)   C-reactive protein (Completed)   Rheumatoid factor (Completed)   Magnesium (Completed)   US THYROID (Completed)   ANA+ENA+DNA/DS+Scl 70+SjoSSA/B (Completed)   Orofacial granulomatosis   Relevant Orders   CBC with Differential/Platelet (Completed)   Comprehensive metabolic panel (Completed)   B12 and Folate Panel (Completed)   Iron, TIBC and Ferritin Panel (Completed)   T4, free (Completed)   TSH (Completed)   VITAMIN D 25 Hydroxy (Vit-D Deficiency, Fractures) (Completed)   C-reactive  protein (Completed)   Rheumatoid factor (Completed)   Magnesium (Completed)   US THYROID (Completed)   ANA+ENA+DNA/DS+Scl 70+SjoSSA/B (Completed)   Hypothyroidism due to Hashimoto's thyroiditis   Relevant Orders   CBC with Differential/Platelet (Completed)   Comprehensive metabolic panel (Completed)   B12 and Folate Panel (Completed)   Iron, TIBC and Ferritin Panel (Completed)   T4, free (Completed)   TSH (Completed)   VITAMIN D 25 Hydroxy (Vit-D Deficiency, Fractures) (Completed)   C-reactive protein (Completed)   Rheumatoid factor (Completed)   Magnesium (Completed)   US THYROID (Completed)   ANA+ENA+DNA/DS+Scl 70+SjoSSA/B (Completed)   Anxiety and depression   Relevant Medications   FLUoxetine (PROZAC) 20 MG tablet   Other Relevant Orders   CBC with Differential/Platelet (Completed)   Comprehensive metabolic panel (Completed)   B12 and Folate Panel (Completed)   Iron, TIBC and Ferritin Panel (Completed)   T4, free (Completed)   TSH (Completed)   VITAMIN D 25 Hydroxy (Vit-D Deficiency, Fractures) (Completed)   C-reactive protein (Completed)   Rheumatoid factor (Completed)   Magnesium (Completed)   US THYROID (Completed)   ANA+ENA+DNA/DS+Scl 70+SjoSSA/B (Completed)   Other Visit Diagnoses     Nausea       Relevant Medications   omeprazole (PRILOSEC) 40 MG capsule   Other Relevant Orders   CBC with Differential/Platelet (Completed)    Comprehensive metabolic panel (Completed)   B12 and Folate Panel (Completed)   Iron, TIBC and Ferritin Panel (Completed)   T4, free (Completed)   TSH (Completed)   VITAMIN D 25 Hydroxy (Vit-D Deficiency, Fractures) (Completed)   C-reactive protein (Completed)   Rheumatoid factor (Completed)   Magnesium (Completed)   US THYROID (Completed)   ANA+ENA+DNA/DS+Scl 70+SjoSSA/B (Completed)   Lipase (Completed)   Hair loss       Relevant Orders   CBC with Differential/Platelet (Completed)   Comprehensive metabolic panel (Completed)   B12 and Folate Panel (Completed)   Iron, TIBC and Ferritin Panel (Completed)   T4, free (Completed)   TSH (Completed)   VITAMIN D 25 Hydroxy (Vit-D Deficiency, Fractures) (Completed)   C-reactive protein (Completed)   Rheumatoid factor (Completed)   Magnesium (Completed)   US THYROID (Completed)   ANA+ENA+DNA/DS+Scl 70+SjoSSA/B (Completed)   Shortness of breath       Hot flashes       Vitamin D deficiency       Relevant Medications   Vitamin D, Ergocalciferol, (DRISDOL) 1.25 MG (50000 UNIT) CAPS capsule      Multiple health concerns with no clear etiology at this time.  Patient has had quite a bit of testing in the past with no clear indication of what could be causing her symptoms.  Many of her symptoms do appear to resolve With autoimmune etiology however all known immunologic testing has been negative thus far.  Did discuss with patient that this could potentially be something wrong that we are unable to test for or have an actual diagnosis for at this time.  I am concerned about mental health as her physical health is clearly causing significant distress we certainly will obtain labs for her today and monitor closely for any diagnostic findings that may be present.  We will also obtain ultrasound of the neck for concerns with her thyroid.  We will continue to do research on my and to determine if a correlation with her symptoms and diagnosis can be found.   She may benefit from seeking guidance or assistance from a specialist for autoimmune or endocrinologic conditions.  We will make changes to plan of care based on findings as appropriate.  We will increase Prozac  Time: 60 minutes, >50% spent counseling, care coordination, chart review, and documentation.     Tollie Eth, DNP, AGNP-c 02/22/2022  9:36 PM    History, Medications, Surgery, SDOH, and Family History reviewed and updated as appropriate.

## 2022-02-09 NOTE — Patient Instructions (Addendum)
I want you to increase the omeprazole to twice a day for the next 30 days to see if this helps with any of your stomach symptoms.   I will get some labs and see if we can find any indication of what could be happening.  I am going to reach out to Dr. Dorris Fetch to see if anything jumps out at him or if he has any recommendations. If we can get you in with him soon I will let you know.   Restart the prozac at 10mg  for the first week then increase to 20mg .   I have sent the referral for the ultrasound of the neck.

## 2022-02-10 ENCOUNTER — Encounter (HOSPITAL_BASED_OUTPATIENT_CLINIC_OR_DEPARTMENT_OTHER): Payer: Self-pay | Admitting: Nurse Practitioner

## 2022-02-10 LAB — CBC WITH DIFFERENTIAL/PLATELET
Basophils Absolute: 0.1 10*3/uL (ref 0.0–0.2)
Basos: 1 %
EOS (ABSOLUTE): 0.1 10*3/uL (ref 0.0–0.4)
Eos: 1 %
Hematocrit: 42.9 % (ref 34.0–46.6)
Hemoglobin: 14.6 g/dL (ref 11.1–15.9)
Immature Grans (Abs): 0 10*3/uL (ref 0.0–0.1)
Immature Granulocytes: 0 %
Lymphocytes Absolute: 3 10*3/uL (ref 0.7–3.1)
Lymphs: 32 %
MCH: 30.8 pg (ref 26.6–33.0)
MCHC: 34 g/dL (ref 31.5–35.7)
MCV: 91 fL (ref 79–97)
Monocytes Absolute: 0.8 10*3/uL (ref 0.1–0.9)
Monocytes: 8 %
Neutrophils Absolute: 5.5 10*3/uL (ref 1.4–7.0)
Neutrophils: 58 %
Platelets: 327 10*3/uL (ref 150–450)
RBC: 4.74 x10E6/uL (ref 3.77–5.28)
RDW: 12.2 % (ref 11.7–15.4)
WBC: 9.4 10*3/uL (ref 3.4–10.8)

## 2022-02-10 LAB — ANA+ENA+DNA/DS+SCL 70+SJOSSA/B
ANA Titer 1: POSITIVE — AB
ENA RNP Ab: <0.2 AI (ref 0.0–0.9)
ENA SM Ab Ser-aCnc: <0.2 AI (ref 0.0–0.9)
ENA SSA (RO) Ab: <0.2 AI (ref 0.0–0.9)
ENA SSB (LA) Ab: <0.2 AI (ref 0.0–0.9)
Scleroderma (Scl-70) (ENA) Antibody, IgG: <0.2 AI (ref 0.0–0.9)
dsDNA Ab: <1 IU/mL (ref 0–9)

## 2022-02-10 LAB — COMPREHENSIVE METABOLIC PANEL
ALT: 10 IU/L (ref 0–32)
AST: 12 IU/L (ref 0–40)
Albumin/Globulin Ratio: 2.1 (ref 1.2–2.2)
Albumin: 4.8 g/dL (ref 3.9–4.9)
Alkaline Phosphatase: 63 IU/L (ref 44–121)
BUN/Creatinine Ratio: 23 (ref 9–23)
BUN: 16 mg/dL (ref 6–24)
Bilirubin Total: 0.2 mg/dL (ref 0.0–1.2)
CO2: 23 mmol/L (ref 20–29)
Calcium: 9.8 mg/dL (ref 8.7–10.2)
Chloride: 100 mmol/L (ref 96–106)
Creatinine, Ser: 0.71 mg/dL (ref 0.57–1.00)
Globulin, Total: 2.3 g/dL (ref 1.5–4.5)
Glucose: 95 mg/dL (ref 70–99)
Potassium: 4.4 mmol/L (ref 3.5–5.2)
Sodium: 137 mmol/L (ref 134–144)
Total Protein: 7.1 g/dL (ref 6.0–8.5)
eGFR: 105 mL/min/{1.73_m2} (ref >59)

## 2022-02-10 LAB — C-REACTIVE PROTEIN: CRP: <1 mg/L (ref 0–10)

## 2022-02-10 LAB — T4, FREE: Free T4: 1.36 ng/dL (ref 0.82–1.77)

## 2022-02-10 LAB — IRON,TIBC AND FERRITIN PANEL
Ferritin: 163 ng/mL — ABNORMAL HIGH (ref 15–150)
Iron Saturation: 25 % (ref 15–55)
Iron: 72 ug/dL (ref 27–159)
Total Iron Binding Capacity: 289 ug/dL (ref 250–450)
UIBC: 217 ug/dL (ref 131–425)

## 2022-02-10 LAB — B12 AND FOLATE PANEL
Folate: 7 ng/mL (ref >3.0)
Vitamin B-12: 586 pg/mL (ref 232–1245)

## 2022-02-10 LAB — LIPASE: Lipase: 36 U/L (ref 14–72)

## 2022-02-10 LAB — TSH: TSH: 0.959 u[IU]/mL (ref 0.450–4.500)

## 2022-02-10 LAB — VITAMIN D 25 HYDROXY (VIT D DEFICIENCY, FRACTURES): Vit D, 25-Hydroxy: 26.7 ng/mL — ABNORMAL LOW (ref 30.0–100.0)

## 2022-02-10 LAB — MAGNESIUM: Magnesium: 2.1 mg/dL (ref 1.6–2.3)

## 2022-02-10 LAB — FANA STAINING PATTERNS: Speckled Pattern: 1:80 {titer}

## 2022-02-10 LAB — RHEUMATOID FACTOR: Rheumatoid fact SerPl-aCnc: <10 IU/mL (ref <14.0)

## 2022-02-14 ENCOUNTER — Ambulatory Visit
Admission: RE | Admit: 2022-02-14 | Discharge: 2022-02-14 | Disposition: A | Discharge status: Home / Self Care | Payer: 59 | Source: Ambulatory Visit | Attending: Nurse Practitioner | Admitting: Nurse Practitioner

## 2022-02-14 DIAGNOSIS — Z1231 Encounter for screening mammogram for malignant neoplasm of breast: Secondary | ICD-10-CM

## 2022-02-20 MED ORDER — VITAMIN D (ERGOCALCIFEROL) 1.25 MG (50000 UNIT) PO CAPS: 50000.0000 [IU] | ORAL_CAPSULE | ORAL | 3 refills | Status: DC

## 2022-02-22 ENCOUNTER — Ambulatory Visit
Admission: RE | Admit: 2022-02-22 | Discharge: 2022-02-22 | Disposition: A | Discharge status: Home / Self Care | Payer: 59 | Source: Ambulatory Visit | Attending: Nurse Practitioner | Admitting: Nurse Practitioner

## 2022-02-22 DIAGNOSIS — E063 Autoimmune thyroiditis: Secondary | ICD-10-CM

## 2022-02-22 DIAGNOSIS — R221 Localized swelling, mass and lump, neck: Secondary | ICD-10-CM | POA: Diagnosis not present

## 2022-02-22 DIAGNOSIS — R110 Nausea: Secondary | ICD-10-CM

## 2022-02-22 DIAGNOSIS — K134 Granuloma and granuloma-like lesions of oral mucosa: Secondary | ICD-10-CM

## 2022-02-22 DIAGNOSIS — M2559 Pain in other specified joint: Secondary | ICD-10-CM

## 2022-02-22 DIAGNOSIS — R21 Rash and other nonspecific skin eruption: Secondary | ICD-10-CM

## 2022-02-22 DIAGNOSIS — L659 Nonscarring hair loss, unspecified: Secondary | ICD-10-CM

## 2022-02-22 DIAGNOSIS — F32A Depression, unspecified: Secondary | ICD-10-CM

## 2022-02-27 ENCOUNTER — Telehealth (INDEPENDENT_AMBULATORY_CARE_PROVIDER_SITE_OTHER): Payer: 59 | Admitting: Nurse Practitioner

## 2022-02-27 ENCOUNTER — Encounter (HOSPITAL_BASED_OUTPATIENT_CLINIC_OR_DEPARTMENT_OTHER): Payer: Self-pay | Admitting: Nurse Practitioner

## 2022-02-27 DIAGNOSIS — R21 Rash and other nonspecific skin eruption: Secondary | ICD-10-CM | POA: Diagnosis not present

## 2022-02-27 DIAGNOSIS — F413 Other mixed anxiety disorders: Secondary | ICD-10-CM | POA: Diagnosis not present

## 2022-02-27 DIAGNOSIS — R768 Other specified abnormal immunological findings in serum: Secondary | ICD-10-CM | POA: Diagnosis not present

## 2022-02-27 DIAGNOSIS — K134 Granuloma and granuloma-like lesions of oral mucosa: Secondary | ICD-10-CM | POA: Diagnosis not present

## 2022-02-27 DIAGNOSIS — M2559 Pain in other specified joint: Secondary | ICD-10-CM

## 2022-02-27 DIAGNOSIS — E063 Autoimmune thyroiditis: Secondary | ICD-10-CM

## 2022-02-27 DIAGNOSIS — Z8249 Family history of ischemic heart disease and other diseases of the circulatory system: Secondary | ICD-10-CM | POA: Diagnosis not present

## 2022-02-27 DIAGNOSIS — E038 Other specified hypothyroidism: Secondary | ICD-10-CM

## 2022-02-27 MED ORDER — PROPRANOLOL HCL 10 MG PO TABS: 10.0000 mg | ORAL_TABLET | Freq: Three times a day (TID) | ORAL | 3 refills | Status: DC

## 2022-02-27 NOTE — Assessment & Plan Note (Signed)
Chronic. Well controlled with daily low dose prednisone. Rheumatology referral placed given the multiple symptoms and positive ANA on recent labs.

## 2022-02-27 NOTE — Assessment & Plan Note (Signed)
Coronary artery disease with Chelsea Hill death in grandfather and multiple family members.  Lipid panel ordered today as well as cardiac CT for complete evaluation.  We will plan to follow-up once we have results back.

## 2022-02-27 NOTE — Assessment & Plan Note (Signed)
Multiple symptoms of sun sensitivity, joint pain, oral granulomas, and rashes present.  Testing to this point has been as nonrevealing of causes.  Given that the patient does have positive Hashimoto's as well as positive ANA today does lead me to believe there could be an autoimmune component contributing to the symptoms.  We will send referral to rheumatology for complete evaluation and recommendations.

## 2022-02-27 NOTE — Assessment & Plan Note (Signed)
Chronic. Recent labs stable.

## 2022-02-27 NOTE — Progress Notes (Signed)
Virtual Visit Encounter telephone visit.   I connected with  Chelsea Hill on 02/27/22 at  8:50 AM EDT by secure audio telemedicine application. I verified that I am speaking with the correct person using two identifiers.   I introduced myself as a Publishing rights manager with the practice. The limitations of evaluation and management by telemedicine discussed with the patient and the availability of in person appointments. The patient expressed verbal understanding and consent to proceed.  Participating parties in this visit include: Myself and patient  The patient is: Patient Location: Home I am: Provider Location: Office/Clinic Subjective:    CC and HPI: Chelsea Hill is a 46 y.o. year old female presenting for follow up of labs. Patient reports the following: Labs - She would like to meet with Rheumatology to discuss her labs and her symptoms.  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 She also has the rash that appears on her eyelids and sun sensitivity.   - She has been on low dose steroids daily and this has kept the rash at bay. This has also kept her oral sores away. She tells me she did miss a dose of steroid recently and immediately the rash started to return.   Nausea - Her nausea has completely resolved with Omeprazole. She is tolerating the medication well and has no side effects.   Cardiac Family Hx -- She would like to have her lipids checked and is interested in the cardiac calcium score CT. She has a strong family hx of CVD with her grandfather and his siblings suffering from MI and death in their 82's  Past medical history, Surgical history, Family history not pertinant except as noted below, Social history, Allergies, and medications have been entered into the medical record, reviewed, and corrections made.   Review of Systems:  All review  of systems negative except what is listed in the HPI  Objective:    Alert and oriented x 4 Speaking in clear sentences with no shortness of breath. No distress.  Impression and Recommendations:    Problem List Items Addressed This Visit     Hypothyroidism due to Hashimoto's thyroiditis    Chronic. Recent labs stable.       Relevant Medications   propranolol (INDERAL) 10 MG tablet   Other Relevant Orders   Ambulatory referral to Rheumatology   Rash    Stable as long as prednisone dose is consistent. Recent positive ANA does express concern for autoimmune etiology.       Relevant Orders   Ambulatory referral to Rheumatology   Orofacial granulomatosis    Chronic. Well controlled with daily low dose prednisone. Rheumatology referral placed given the multiple symptoms and positive ANA on recent labs.       Relevant Orders   Ambulatory referral to Rheumatology   Positive ANA (antinuclear antibody) - Primary    Multiple symptoms of sun sensitivity, joint pain, oral granulomas, and rashes present.  Testing to this point has been as nonrevealing of causes.  Given that the patient does have positive Hashimoto's as well as positive ANA today does lead me to believe there could be an autoimmune component contributing to the symptoms.  We will send referral to rheumatology for complete evaluation and recommendations.      Relevant Orders   Ambulatory referral to Rheumatology   Family history of premature CAD  Coronary artery disease with Chelsea Hill death in grandfather and multiple family members.  Lipid panel ordered today as well as cardiac CT for complete evaluation.  We will plan to follow-up once we have results back.      Relevant Orders   Lipid panel   CT CARDIAC SCORING (SELF PAY ONLY)   Joint pain   Relevant Orders   Ambulatory referral to Rheumatology   Other Visit Diagnoses     Other mixed anxiety disorders       Relevant Medications   propranolol (INDERAL) 10 MG  tablet       orders and follow up as documented in EMR I discussed the assessment and treatment plan with the patient. The patient was provided an opportunity to ask questions and all were answered. The patient agreed with the plan and demonstrated an understanding of the instructions.   The patient was advised to call back or seek an in-person evaluation if the symptoms worsen or if the condition fails to improve as anticipated.  Follow-Up: after labs and/or imaging, consults, etc. have been completed  I provided 25 minutes of non-face-to-face interaction with this non face-to-face encounter including intake, same-day documentation, and chart review.   Orma Render, NP , DNP, AGNP-c [place] [facility] [phone] (fax)

## 2022-02-27 NOTE — Assessment & Plan Note (Signed)
Stable as long as prednisone dose is consistent. Recent positive ANA does express concern for autoimmune etiology.

## 2022-02-27 NOTE — Assessment & Plan Note (Addendum)
>>  ASSESSMENT AND PLAN FOR PSORIATIC ARTHRITIS (HCC) WRITTEN ON 07/10/2023  7:40 AM BY Stashia Sia E, NP   >>ASSESSMENT AND PLAN FOR OROFACIAL GRANULOMATOSIS WRITTEN ON 02/27/2022 12:23 PM BY Cornelius Marullo E, NP  Chronic. Well controlled with daily low dose prednisone. Rheumatology referral placed given the multiple symptoms and positive ANA on recent labs.    >>ASSESSMENT AND PLAN FOR RASH WRITTEN ON 02/27/2022 12:24 PM BY Keondra Haydu E, NP  Stable as long as prednisone dose is consistent. Recent positive ANA does express concern for autoimmune etiology.

## 2022-03-17 ENCOUNTER — Ambulatory Visit (HOSPITAL_COMMUNITY)
Admission: RE | Admit: 2022-03-17 | Discharge: 2022-03-17 | Disposition: A | Discharge status: Home / Self Care | Payer: 59 | Source: Ambulatory Visit | Attending: Nurse Practitioner | Admitting: Nurse Practitioner

## 2022-03-17 DIAGNOSIS — Z8249 Family history of ischemic heart disease and other diseases of the circulatory system: Secondary | ICD-10-CM | POA: Insufficient documentation

## 2022-06-18 ENCOUNTER — Other Ambulatory Visit (HOSPITAL_BASED_OUTPATIENT_CLINIC_OR_DEPARTMENT_OTHER): Payer: Self-pay | Admitting: Nurse Practitioner

## 2022-06-18 DIAGNOSIS — R21 Rash and other nonspecific skin eruption: Secondary | ICD-10-CM

## 2022-06-19 NOTE — Telephone Encounter (Signed)
Refill request last apt 02/27/22 is pt. Supposed to be on prednisone.

## 2022-07-03 NOTE — Progress Notes (Unsigned)
Office Visit Note  Patient: Chelsea Hill             Date of Birth: [birth year]           MRN: [id number]             PCP: No primary care provider on file. Referring: Orma Render, NP Visit Date: 07/04/2022 Occupation: '@GUAROCC'$ @  Subjective:  No chief complaint on file.   History of Present Illness: Chelsea Hill is a 47 y.o. female here for evaluation of positive ANA and multiple chronic symptoms including joint pain, fatigue, rashes, weight loss, and other problems with episodic flare ups. ***   Labs reviewed 01/2022 ANA 1:80 RF neg CCP neg ESR wnl CRP wnl  10/2020 ANA neg  Activities of Daily Living:  Patient reports morning stiffness for *** {minute/hour:19697}.   Patient {ACTIONS;DENIES/REPORTS:21021675::"Denies"} nocturnal pain.  Difficulty dressing/grooming: {ACTIONS;DENIES/REPORTS:21021675::"Denies"} Difficulty climbing stairs: {ACTIONS;DENIES/REPORTS:21021675::"Denies"} Difficulty getting out of chair: {ACTIONS;DENIES/REPORTS:21021675::"Denies"} Difficulty using hands for taps, buttons, cutlery, and/or writing: {ACTIONS;DENIES/REPORTS:21021675::"Denies"}  No Rheumatology ROS completed.   PMFS History:  Patient Active Problem List   Diagnosis Date Noted   Positive ANA (antinuclear antibody) 02/27/2022   Family history of premature CAD 02/27/2022   Acute bilateral low back pain with bilateral sciatica 06/07/2021   Greater trochanteric pain syndrome of left lower extremity 05/25/2021   Coccyx pain 05/25/2021   COVID-19 01/11/2021   Joint pain 11/25/2020   Rash 11/25/2020   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   Tobacco use disorder, continuous 11/25/2020   Hypothyroidism due to Hashimoto's thyroiditis 10/07/2020   Anxiety and depression 10/07/2020   Eyelid dermatitis, allergic/contact 10/07/2020    Past Medical History:  Diagnosis Date   Alcohol addiction ([place])    Anxiety    Depression    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   Physical Exam   Musculoskeletal Exam: ***  CDAI Exam: CDAI Score: -- Patient Global: --; Provider Global: -- Swollen: --; Tender: -- Joint Exam 07/04/2022   No joint exam has been documented for this visit   There is currently no information documented on the homunculus. Go to the Rheumatology activity and complete the homunculus joint exam.  Investigation: No additional findings.  Imaging: No results found.  Recent Labs: Lab Results  Component Value Date   WBC 9.4 02/09/2022   HGB 14.6 02/09/2022   PLT 327 02/09/2022   NA 137 02/09/2022   K 4.4 02/09/2022   CL 100 02/09/2022   CO2 23 02/09/2022   GLUCOSE 95 02/09/2022   BUN 16 02/09/2022   CREATININE 0.71 02/09/2022   BILITOT 0.2 02/09/2022   ALKPHOS 63 02/09/2022   AST 12 02/09/2022   ALT 10 02/09/2022   PROT 7.1 02/09/2022   ALBUMIN 4.8 02/09/2022   CALCIUM 9.8 02/09/2022    Speciality Comments: No specialty comments available.  Procedures:  No procedures performed Allergies: Venlafaxine  Assessment / Plan:     Visit Diagnoses: No diagnosis found.  Orders: No orders of the defined types were placed in this encounter.  No orders of the defined types were placed in this encounter.   Face-to-face time spent with patient was *** minutes. Greater than 50% of time was spent in counseling and coordination of care.  Follow-Up Instructions: No follow-ups on file.   Collier Salina, MD  Note  - This record has been created using [employer].  Chart creation errors have been sought, but may not always  have been located. Such creation errors do not reflect on  the standard of medical care.

## 2022-07-04 ENCOUNTER — Ambulatory Visit: Payer: PRIVATE HEALTH INSURANCE | Attending: Internal Medicine | Admitting: Internal Medicine

## 2022-07-04 ENCOUNTER — Encounter: Payer: Self-pay | Admitting: Internal Medicine

## 2022-07-04 VITALS — BP 95/60 | HR 69 | Resp 16 | Ht 63.5 in | Wt 122.0 lb

## 2022-07-04 DIAGNOSIS — R768 Other specified abnormal immunological findings in serum: Secondary | ICD-10-CM | POA: Diagnosis not present

## 2022-07-04 DIAGNOSIS — M2559 Pain in other specified joint: Secondary | ICD-10-CM | POA: Diagnosis not present

## 2022-07-04 DIAGNOSIS — H01119 Allergic dermatitis of unspecified eye, unspecified eyelid: Secondary | ICD-10-CM | POA: Diagnosis not present

## 2022-07-04 DIAGNOSIS — R21 Rash and other nonspecific skin eruption: Secondary | ICD-10-CM | POA: Diagnosis not present

## 2022-07-26 ENCOUNTER — Other Ambulatory Visit: Payer: Self-pay | Admitting: *Deleted

## 2022-07-26 DIAGNOSIS — R768 Other specified abnormal immunological findings in serum: Secondary | ICD-10-CM

## 2022-07-26 DIAGNOSIS — M2559 Pain in other specified joint: Secondary | ICD-10-CM

## 2022-07-26 DIAGNOSIS — R21 Rash and other nonspecific skin eruption: Secondary | ICD-10-CM

## 2022-08-05 LAB — C3 AND C4
C3 Complement: 85 mg/dL (ref 83–193)
C4 Complement: 23 mg/dL (ref 15–57)

## 2022-08-05 LAB — IGG, IGA, IGM
IgG (Immunoglobin G), Serum: 801 mg/dL (ref 600–1640)
IgM, Serum: 162 mg/dL (ref 50–300)
Immunoglobulin A: 132 mg/dL (ref 47–310)

## 2022-08-05 LAB — ANA: Anti Nuclear Antibody (ANA): POSITIVE — AB

## 2022-08-05 LAB — ANTI-NUCLEAR AB-TITER (ANA TITER)
ANA TITER: 1:80 {titer} — ABNORMAL HIGH
ANA Titer 1: 1:80 {titer} — ABNORMAL HIGH

## 2022-08-05 LAB — SEDIMENTATION RATE: Sed Rate: 9 mm/h (ref 0–20)

## 2022-08-05 LAB — C-REACTIVE PROTEIN: CRP: 0.8 mg/L (ref <8.0)

## 2022-08-05 LAB — HISTAMINE REL. (CHRONIC URTICARIA): Histamine Release: <16 % (ref <16)

## 2022-08-05 LAB — CYCLIC CITRUL PEPTIDE ANTIBODY, IGG: Cyclic Citrullin Peptide Ab: <16 UNITS

## 2022-08-08 ENCOUNTER — Ambulatory Visit: Payer: Commercial Managed Care - PPO | Admitting: Internal Medicine

## 2022-08-13 NOTE — Progress Notes (Signed)
Office Visit Note  Patient: Chelsea Hill             Date of Birth: 1976-03-25           MRN: [id number]             PCP: Tollie Eth, NP Referring: Tollie Eth, NP Visit Date: 08/14/2022   Subjective:  Follow-up (Patient states the last four weeks have been very bad. Patient states some days she just stays in bed and that it makes her depressed.)   History of Present Illness: Chelsea Hill is a 47 y.o. female here for follow up for her joint pain and rashes in multiple areas currently feeling very poorly after tapering down the prednisone since our last visit.  Lab testing at that visit redemonstrated a positive ANA but was otherwise completely unremarkable including acute inflammatory markers and immunoglobulins.  Histamine release assay was also unremarkable.   Previous HPI 07/04/22 Chelsea Hill is a 47 y.o. female here for evaluation of positive ANA and multiple chronic symptoms including joint pain, fatigue, and rashes, and other problems with episodic flare ups.  She noticed problems new or severely increased when she quit smoking about 5 years ago.  She developed facial rashes and joint pain and swelling in multiple areas especially with bilateral knee swelling pain and decreased motion. Lived in [place] at the time had a evaluation at a local office then [facility].  She saw orthopedic surgery clinic with symptoms attributed to osteoarthritis.  Subsequently evaluated with rheumatology due to inflammatory changes and having recurrent tendinitis or enthesitis episodes.  X-ray and MRI imaging studies were apparently mostly unremarkable besides some degenerative changes.  Subsequently started breaking out in skin rashes throughout her torso with numerous isolated erythematous papules the subsequently involuted or flattened leaving behind small erythematous patches that resolved over a course of multiple weeks.  She had biopsy of the skin rash reports this was  apparently nonspecific with some type of pathology or immunohistochemical staining suggestive for an autoimmune process but not definitive.  She also saw some scalp rash and generalized hair thinning evaluation for this also including biopsy reports this demonstrated telogen effluvium but was otherwise nonspecific.  Also found to have Hashimoto's thyroiditis and started on low-dose levothyroxine supplementation.  She did not start any other disease specific long-term medications though intermittent use of steroids improve her symptoms dramatically.  She resumed cigarette smoking this resolved her symptoms pretty complet ely for about 9 months until they started to occur at a milder severity than at the onset. She moved to [place] about 2-1/2 years ago noticed new symptom with more severe erythematous facial rash including swelling over the nose and over upper eyelids even affecting her vision.  Lab testing has been mostly unremarkable with a repeat checked late last year showed a positive ANA with negative reflex antibody panel.  Symptoms continue to improve while on steroids but flareup again off of treatment.  Also found partial benefit with skin rashes adding oral antihistamine to low-dose prednisone.  Due to review found of symptoms every time she stopped she is now on prednisone 10 mg daily for the past 4 months Symptoms are fairly well-controlled today.  Most significant joint problem right now is that affecting her hips with tendinitis pain or greater trochanteric pain.  This has been an ongoing problem with previous involvement at epicondylitis on both elbows and Achilles tendinitis in both ankles.     Labs  reviewed 01/2022 ANA 1:80 RF neg ESR wnl CRP wnl   10/2020 ANA neg   Review of Systems  Constitutional:  Positive for fatigue.  HENT:  Negative for mouth sores and mouth dryness.   Eyes:  Positive for dryness.  Respiratory:  Positive for shortness of breath.   Cardiovascular:   Positive for palpitations. Negative for chest pain.  Gastrointestinal:  Negative for blood in stool, constipation and diarrhea.  Endocrine: Positive for increased urination.  Genitourinary:  Positive for involuntary urination.  Musculoskeletal:  Positive for joint pain, gait problem, joint pain, joint swelling, myalgias, muscle weakness, morning stiffness, muscle tenderness and myalgias.  Skin:  Positive for color change and rash. Negative for hair loss and sensitivity to sunlight.  Allergic/Immunologic: Negative for susceptible to infections.  Neurological:  Negative for dizziness and headaches.  Hematological:  Positive for swollen glands.  Psychiatric/Behavioral:  Positive for depressed mood and sleep disturbance. The patient is nervous/anxious.     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   Tobacco use disorder, continuous 11/25/2020   Hypothyroidism due to Hashimoto's thyroiditis 10/07/2020   Anxiety and depression 10/07/2020   Eyelid dermatitis, allergic/contact 10/07/2020    Past Medical History:  Diagnosis Date   Alcohol addiction ([facility])    Anxiety    Depression    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[facility])    Hashimoto's disease    Hyperlipidemia    Orofacial granulomatosis    Thyroid disease     Family History  Problem Relation Age of Onset   Lung cancer Mother    Alcoholism Mother    Hypertension Father    Heart disease Father    Hyperlipidemia Brother    Breast cancer Maternal Aunt    Past Surgical  History:  Procedure Laterality Date   ABDOMINAL HYSTERECTOMY     BILATERAL CARPAL TUNNEL RELEASE Bilateral    SPINE SURGERY     Social History   Social History Narrative   Not on file   Immunization History  Administered Date(s) Administered   PFIZER(Purple Top)SARS-COV-2 Vaccination 12/17/2019, 01/07/2020     Objective: Vital Signs: BP 110/73 (BP Location: Left Arm, Patient Position: Sitting, Cuff Size: Normal)   Pulse 75   Resp 12   Ht 5\' 3"  (1.6 m)   Wt 122 lb (55.3 kg)   BMI 21.61 kg/m    Physical Exam Cardiovascular:     Rate and Rhythm: Normal rate and regular rhythm.  Pulmonary:     Effort: Pulmonary effort is normal.     Breath sounds: Normal breath sounds.  Skin:    General: Skin is warm and dry.  Neurological:     Mental Status: She is alert.  Psychiatric:        Mood and Affect: Mood normal.      Musculoskeletal Exam:  Shoulders full ROM no tenderness or swelling Elbows full ROM no tenderness or swelling Wrists full ROM no tenderness or swelling Fingers full ROM no tenderness or swelling No paraspinal tenderness to palpation over upper and lower back Hip  normal internal and external rotation without pain, no tenderness to lateral hip palpation Knees full ROM no tenderness or swelling   Investigation: No additional findings.  Imaging: No results found.  Recent Labs: Lab Results  Component Value Date   WBC 9.4 02/09/2022   HGB 14.6 02/09/2022   PLT 327 02/09/2022   NA 137 02/09/2022   K 4.4 02/09/2022   CL 100 02/09/2022   CO2 23 02/09/2022   GLUCOSE 95 02/09/2022   BUN 16 02/09/2022   CREATININE 0.71 02/09/2022   BILITOT 0.2 02/09/2022   ALKPHOS 63 02/09/2022   AST 12 02/09/2022   ALT 10 02/09/2022   PROT 7.1 02/09/2022   ALBUMIN 4.8 02/09/2022   CALCIUM 9.8 02/09/2022   QFTBGOLDPLUS NEGATIVE 08/14/2022    Speciality Comments: No specialty comments available.  Procedures:  No procedures performed Allergies: Venlafaxine    Assessment / Plan:     Visit Diagnoses: Polyarthritis - Plan: sulfaSALAzine (AZULFIDINE) 500 MG tablet, Sedimentation rate, C-reactive protein Rash - Plan: sulfaSALAzine (AZULFIDINE) 500 MG tablet  Recommend trial of sulfasalazine for joint pain and rash this may be beneficial both for direct inflammatory effect also immunomodulatory treatment.  Recheck the sed rate and CRP now that she is off steroids but still may not be reliable as she has had inflammation despite normal labs in the past.  Plan to start sulfasalazine she can titrate up to 1000 mg twice daily if tolerating starting from 500 mg twice daily.  Long term current use of systemic steroids - Plan: Cortisol  Feeling severely worse after stopping steroids not just limited to joint and skin symptoms.  Question if she could be developing secondary adrenal insufficiency with chronic repetitive steroid use will check cortisol level today that this is an afternoon value.  High risk medication use - Plan: QuantiFERON-TB Gold Plus  Discussed risks of sulfasalazine medication including GI intolerance, cytopenias and need for monitoring.  Will also check a TB screening now while she is off steroids in case needing to try additional biologic DMARD in future.  Orders: Orders Placed This Encounter  Procedures   Cortisol   Sedimentation rate   C-reactive protein   QuantiFERON-TB Gold Plus   Meds ordered this encounter  Medications   sulfaSALAzine (AZULFIDINE) 500 MG tablet    Sig: Take 2 tablets (1,000 mg total) by mouth 2 (two) times daily. Increase by 1 tablet per day each week until treatment dose    Dispense:  120 tablet    Refill:  1     Follow-Up Instructions: No follow-ups on file.   Fuller Plan, MD  Note - This record has been created using [employer].  Chart creation errors have been sought, but may not always  have been located. Such creation errors do not reflect on  the standard of medical care.

## 2022-08-14 ENCOUNTER — Encounter: Payer: Self-pay | Admitting: Internal Medicine

## 2022-08-14 ENCOUNTER — Other Ambulatory Visit: Payer: Self-pay | Admitting: Internal Medicine

## 2022-08-14 ENCOUNTER — Ambulatory Visit: Payer: PRIVATE HEALTH INSURANCE | Attending: Internal Medicine | Admitting: Internal Medicine

## 2022-08-14 VITALS — BP 110/73 | HR 75 | Resp 12 | Ht 63.0 in | Wt 122.0 lb

## 2022-08-14 DIAGNOSIS — Z7952 Long term (current) use of systemic steroids: Secondary | ICD-10-CM | POA: Insufficient documentation

## 2022-08-14 DIAGNOSIS — L4050 Arthropathic psoriasis, unspecified: Secondary | ICD-10-CM | POA: Insufficient documentation

## 2022-08-14 DIAGNOSIS — M130 Polyarthritis, unspecified: Secondary | ICD-10-CM

## 2022-08-14 DIAGNOSIS — Z79899 Other long term (current) drug therapy: Secondary | ICD-10-CM

## 2022-08-14 DIAGNOSIS — R21 Rash and other nonspecific skin eruption: Secondary | ICD-10-CM

## 2022-08-14 DIAGNOSIS — R768 Other specified abnormal immunological findings in serum: Secondary | ICD-10-CM

## 2022-08-14 HISTORY — DX: Long term (current) use of systemic steroids: Z79.52

## 2022-08-14 MED ORDER — SULFASALAZINE 500 MG PO TABS: 1000.0000 mg | ORAL_TABLET | Freq: Two times a day (BID) | ORAL | 1 refills | Status: DC

## 2022-08-14 NOTE — Patient Instructions (Signed)
Sulfasalazine Tablets What is this medication? SULFASALAZINE (sul fa SAL a zeen) treats ulcerative colitis. It works by decreasing inflammation. It belongs to a group of medications called salicylates. This medicine may be used for other purposes; ask your health care provider or pharmacist if you have questions. COMMON BRAND NAME(S): Azulfidine, Sulfazine What should I tell my care team before I take this medication? They need to know if you have any of these conditions: Asthma Blood disorders or anemia Glucose-6-phosphate dehydrogenase (G6PD) deficiency Intestinal obstruction Kidney disease Liver disease Porphyria Urinary tract obstruction An unusual reaction to sulfasalazine, sulfa medications, salicylates, or other medications, foods, dyes, or preservatives Pregnant or trying to get pregnant Breast-feeding How should I use this medication? Take this medication by mouth with a full glass of water. Take it as directed on the prescription label at the same time every day. You can take it with or without food. If it upsets your stomach, take it with food. Keep taking it unless your care team tells you to stop. Talk to your care team about the use of this medication in children. While this medication may be prescribed for children as young as 6 years for selected conditions, precautions do apply. Patients over 65 years old may have a stronger reaction and need a smaller dose. Overdosage: If you think you have taken too much of this medicine contact a poison control center or emergency room at once. NOTE: This medicine is only for you. Do not share this medicine with others. What if I miss a dose? If you miss a dose, take it as soon as you can. If it is almost time for your next dose, take only that dose. Do not take double or extra doses. What may interact with this medication? Digoxin Folic acid This list may not describe all possible interactions. Give your health care provider a list  of all the medicines, herbs, non-prescription drugs, or dietary supplements you use. Also tell them if you smoke, drink alcohol, or use illegal drugs. Some items may interact with your medicine. What should I watch for while using this medication? Visit your care team for regular checks on your progress. Tell your care team if your symptoms do not start to get better or if they get worse. You will need frequent blood and urine checks. This medication can make you more sensitive to the sun. Keep out of the sun. If you cannot avoid being in the sun, wear protective clothing and use sunscreen. Do not use sun lamps or tanning beds/booths. Drink plenty of water while taking this medication. What side effects may I notice from receiving this medication? Side effects that you should report to your care team as soon as possible: Allergic reactions--skin rash, itching, hives, swelling of the face, lips, tongue, or throat Aplastic anemia--unusual weakness or fatigue, dizziness, headache, trouble breathing, increased bleeding or bruising Dry cough, shortness of breath or trouble breathing Heart muscle inflammation--unusual weakness or fatigue, shortness of breath, chest pain, fast or irregular heartbeat, dizziness, swelling of the ankles, feet, or hands Infection--fever, chills, cough, sore throat, wounds that don't heal, pain or trouble when passing urine, general feeling of discomfort or being unwell Kidney injury--decrease in the amount of urine, swelling of the ankles, hands, or feet Liver injury--right upper belly pain, loss of appetite, nausea, light-colored stool, dark yellow or brown urine, yellowing skin or eyes, unusual weakness or fatigue Rash, fever, and swollen lymph nodes Redness, blistering, peeling, or loosening of the skin, including inside   the mouth Side effects that usually do not require medical attention (report to your care team if they continue or are bothersome): Dark yellow or orange  saliva, sweat, or urine Dizziness Headache Loss of appetite Nausea Upset stomach Vomiting This list may not describe all possible side effects. Call your doctor for medical advice about side effects. You may report side effects to FDA at 1-800-FDA-1088. Where should I keep my medication? Keep out of the reach of children and pets. Store at room temperature between 15 and 30 degrees C (59 and 86 degrees F). Get rid of any unused medication after the expiration date. To get rid of medications that are no longer needed or have expired: Take the medications to a medication take-back program. Check with your pharmacy or law enforcement to find a location. If you cannot return the medication, check the label or package insert to see if the medication should be thrown out in the garbage or flushed down the toilet. If you are not sure, ask your care team. If it is safe to put it in the trash, take the medication out of the container. Mix the medication with cat litter, dirt, coffee grounds, or other unwanted substance. Seal the mixture in a bag or container. Put it in the trash. NOTE: This sheet is a summary. It may not cover all possible information. If you have questions about this medicine, talk to your doctor, pharmacist, or health care provider.  2023 Elsevier/Gold Standard (2021-01-24 00:00:00)  

## 2022-08-16 LAB — C-REACTIVE PROTEIN: CRP: <3 mg/L (ref <8.0)

## 2022-08-16 LAB — QUANTIFERON-TB GOLD PLUS
Mitogen-NIL: 8.75 IU/mL
NIL: 0.18 IU/mL
QuantiFERON-TB Gold Plus: NEGATIVE
TB1-NIL: <0 IU/mL
TB2-NIL: <0 IU/mL

## 2022-08-16 LAB — CORTISOL: Cortisol, Plasma: 6.5 ug/dL

## 2022-08-16 LAB — SEDIMENTATION RATE: Sed Rate: 6 mm/h (ref 0–20)

## 2022-08-21 NOTE — Progress Notes (Signed)
Sedimentation rate and CRP remain normal off the prednisone. Her cortisol level was normal does not suggest adrenal insufficiency as a cause for feeling so much worse off the prednisone.

## 2022-08-29 ENCOUNTER — Encounter: Payer: Commercial Managed Care - PPO | Admitting: Nurse Practitioner

## 2022-08-29 DIAGNOSIS — Z0000 Encounter for general adult medical examination without abnormal findings: Secondary | ICD-10-CM

## 2022-09-07 ENCOUNTER — Encounter: Payer: Self-pay | Admitting: Nurse Practitioner

## 2022-10-08 NOTE — Progress Notes (Unsigned)
Office Visit Note  Patient: Chelsea Hill             Date of Birth: 07-10-1975           MRN: [id number]             PCP: Tollie Eth, NP Referring: Tollie Eth, NP Visit Date: 10/09/2022   Subjective:  No chief complaint on file.   History of Present Illness: Chelsea Hill is a 47 y.o. female here for follow up ***   Previous HPI 08/14/22 Chelsea Hill is a 47 y.o. female here for follow up for her joint pain and rashes in multiple areas currently feeling very poorly after tapering down the prednisone since our last visit.  Lab testing at that visit redemonstrated a positive ANA but was otherwise completely unremarkable including acute inflammatory markers and immunoglobulins.  Histamine release assay was also unremarkable.     Previous HPI 07/04/22 Chelsea Hill is a 47 y.o. female here for evaluation of positive ANA and multiple chronic symptoms including joint pain, fatigue, and rashes, and other problems with episodic flare ups.  She noticed problems new or severely increased when she quit smoking about 5 years ago.  She developed facial rashes and joint pain and swelling in multiple areas especially with bilateral knee swelling pain and decreased motion. Lived in [place] at the time had a evaluation at a local office then [facility].  She saw orthopedic surgery clinic with symptoms attributed to osteoarthritis.  Subsequently evaluated with rheumatology due to inflammatory changes and having recurrent tendinitis or enthesitis episodes.  X-ray and MRI imaging studies were apparently mostly unremarkable besides some degenerative changes.  Subsequently started breaking out in skin rashes throughout her torso with numerous isolated erythematous papules the subsequently involuted or flattened leaving behind small erythematous patches that resolved over a course of multiple weeks.  She had biopsy of the skin rash reports this was apparently nonspecific with some  type of pathology or immunohistochemical staining suggestive for an autoimmune process but not definitive.  She also saw some scalp rash and generalized hair thinning evaluation for this also including biopsy reports this demonstrated telogen effluvium but was otherwise nonspecific.  Also found to have Hashimoto's thyroiditis and started on low-dose levothyroxine supplementation.  She did not start any other disease specific long-term medications though intermittent use of steroids improve her symptoms dramatically.  She resumed cigarette smoking this resolved her symptoms pretty complet ely for about 9 months until they started to occur at a milder severity than at the onset. She moved to [place] about 2-1/2 years ago noticed new symptom with more severe erythematous facial rash including swelling over the nose and over upper eyelids even affecting her vision.  Lab testing has been mostly unremarkable with a repeat checked late last year showed a positive ANA with negative reflex antibody panel.  Symptoms continue to improve while on steroids but flareup again off of treatment.  Also found partial benefit with skin rashes adding oral antihistamine to low-dose prednisone.  Due to review found of symptoms every time she stopped she is now on prednisone 10 mg daily for the past 4 months Symptoms are fairly well-controlled today.  Most significant joint problem right now is that affecting her hips with tendinitis pain or greater trochanteric pain.  This has been an ongoing problem with previous involvement at epicondylitis on both elbows and Achilles tendinitis in both ankles.     Labs reviewed  01/2022 ANA 1:80 RF neg ESR wnl CRP wnl   10/2020 ANA neg   No Rheumatology ROS completed.   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   Tobacco use disorder, continuous 11/25/2020   Hypothyroidism due to Hashimoto's thyroiditis 10/07/2020   Anxiety and depression 10/07/2020   Eyelid dermatitis, allergic/contact 10/07/2020    Past Medical History:  Diagnosis Date   Alcohol addiction (HCC)    Anxiety    Depression    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HCC)    Hashimoto's disease    Hyperlipidemia    Orofacial granulomatosis    Thyroid disease     Family History  Problem Relation Age of Onset   Lung cancer Mother    Alcoholism Mother    Hypertension Father    Heart disease Father    Hyperlipidemia Brother    Breast cancer Maternal Aunt    Past Surgical History:  Procedure Laterality Date   ABDOMINAL HYSTERECTOMY     BILATERAL CARPAL TUNNEL RELEASE Bilateral    SPINE SURGERY     Social History   Social History Narrative   Not on file   Immunization History  Administered Date(s) Administered   PFIZER(Purple Top)SARS-COV-2 Vaccination 12/17/2019, 01/07/2020     Objective: Vital Signs: There were no vitals taken for this visit.   Physical Exam   Musculoskeletal Exam: ***  CDAI Exam: CDAI Score: -- Patient Global: --; Provider Global: -- Swollen: --; Tender: -- Joint Exam 10/09/2022   No joint exam has been documented for this visit   There is currently no information documented on the homunculus. Go to the Rheumatology activity and complete the homunculus joint exam.  Investigation: No additional findings.  Imaging: No results found.  Recent Labs: Lab Results  Component Value Date   WBC 9.4 02/09/2022   HGB 14.6 02/09/2022   PLT 327 02/09/2022   NA 137 02/09/2022   K 4.4 02/09/2022    CL 100 02/09/2022   CO2 23 02/09/2022   GLUCOSE 95 02/09/2022   BUN 16 02/09/2022   CREATININE 0.71 02/09/2022   BILITOT 0.2 02/09/2022   ALKPHOS 63 02/09/2022   AST 12 02/09/2022   ALT 10 02/09/2022   PROT 7.1 02/09/2022   ALBUMIN 4.8 02/09/2022   CALCIUM 9.8 02/09/2022   QFTBGOLDPLUS NEGATIVE 08/14/2022    Speciality Comments: No specialty comments available.  Procedures:  No procedures performed Allergies: Venlafaxine   Assessment / Plan:     Visit Diagnoses: No diagnosis found.  ***  Orders: No orders of the defined types were placed in this encounter.  No orders of the defined types were placed in this encounter.    Follow-Up Instructions: No follow-ups on file.   Fuller Plan, MD  Note - This record has been created using [employer].  Chart creation errors have been sought, but may not always  have been located. Such creation errors do not reflect on  the standard of medical care.

## 2022-10-09 ENCOUNTER — Ambulatory Visit: Payer: PRIVATE HEALTH INSURANCE | Attending: Internal Medicine | Admitting: Internal Medicine

## 2022-10-09 ENCOUNTER — Encounter: Payer: Self-pay | Admitting: Internal Medicine

## 2022-10-09 VITALS — BP 112/69 | HR 84 | Resp 14 | Ht 63.0 in | Wt 125.0 lb

## 2022-10-09 DIAGNOSIS — M130 Polyarthritis, unspecified: Secondary | ICD-10-CM | POA: Diagnosis not present

## 2022-10-09 DIAGNOSIS — R21 Rash and other nonspecific skin eruption: Secondary | ICD-10-CM | POA: Diagnosis not present

## 2022-10-09 MED ORDER — SULFASALAZINE 500 MG PO TABS: ORAL_TABLET | ORAL | 2 refills | Status: DC

## 2022-10-10 LAB — CBC WITH DIFFERENTIAL/PLATELET
Absolute Monocytes: 616 cells/uL (ref 200–950)
Basophils Absolute: 72 cells/uL (ref 0–200)
Basophils Relative: 0.9 %
Eosinophils Absolute: 208 cells/uL (ref 15–500)
Eosinophils Relative: 2.6 %
HCT: 39.9 % (ref 35.0–45.0)
Hemoglobin: 13.2 g/dL (ref 11.7–15.5)
Lymphs Abs: 2288 cells/uL (ref 850–3900)
MCH: 30.3 pg (ref 27.0–33.0)
MCHC: 33.1 g/dL (ref 32.0–36.0)
MCV: 91.5 fL (ref 80.0–100.0)
MPV: 9.4 fL (ref 7.5–12.5)
Monocytes Relative: 7.7 %
Neutro Abs: 4816 cells/uL (ref 1500–7800)
Neutrophils Relative %: 60.2 %
Platelets: 322 10*3/uL (ref 140–400)
RBC: 4.36 10*6/uL (ref 3.80–5.10)
RDW: 12.8 % (ref 11.0–15.0)
Total Lymphocyte: 28.6 %
WBC: 8 10*3/uL (ref 3.8–10.8)

## 2022-10-10 LAB — COMPLETE METABOLIC PANEL WITH GFR
AG Ratio: 1.6 (calc) (ref 1.0–2.5)
ALT: 11 U/L (ref 6–29)
AST: 15 U/L (ref 10–35)
Albumin: 4.4 g/dL (ref 3.6–5.1)
Alkaline phosphatase (APISO): 69 U/L (ref 31–125)
BUN: 17 mg/dL (ref 7–25)
CO2: 26 mmol/L (ref 20–32)
Calcium: 9.7 mg/dL (ref 8.6–10.2)
Chloride: 104 mmol/L (ref 98–110)
Creat: 0.7 mg/dL (ref 0.50–0.99)
Globulin: 2.7 g/dL (calc) (ref 1.9–3.7)
Glucose, Bld: 95 mg/dL (ref 65–99)
Potassium: 4.8 mmol/L (ref 3.5–5.3)
Sodium: 138 mmol/L (ref 135–146)
Total Bilirubin: 0.3 mg/dL (ref 0.2–1.2)
Total Protein: 7.1 g/dL (ref 6.1–8.1)
eGFR: 107 mL/min/{1.73_m2} (ref >=60)

## 2022-10-10 NOTE — Progress Notes (Signed)
Blood count and metabolic panel are normal, no problem for continuing sulfasalazine.

## 2022-11-30 NOTE — Progress Notes (Signed)
Office Visit Note  Patient: Chelsea Hill             Date of Birth: 1975-11-22           MRN: [id number]             PCP: Tollie Eth, NP Referring: Tollie Eth, NP Visit Date: 12/11/2022   Subjective:  Follow-up (Patient states the sulfasalazine is still making her sick. )   History of Present Illness: Chelsea Hill is a 47 y.o. 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  Currently tolerating taking 2 tablets at night only.  She reports a definite change in symptoms while on sulfasalazine partial improvement in joint pain and stiffness and her rashes are well-controlled.  When stopping the medicine entirely for 4 days or longer started to notice rash coming back in multiple areas including very itchy inflammation on the scalp.  She had to mild viral upper respiratory infections since our last visit no complications.  Previous HPI 10/09/2022 Chelsea Hill is a 47 y.o. 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 She has seen good improvement in skin rash and pleuritic chest pain symptoms are currently resolved. Joint pains including hips which were worst are about 75% better right now. She notices nausea every day while taking 1000 mg BID dose, but was tolerating 1 tablet BID without issue. Sometimes skips a dose when having worse upset. No other side effect noted. Still fatigued, maybe 25% better not as much as her joint issues.   Previous HPI 08/14/22 Chelsea Hill is a 47 y.o. female here for follow up for her joint pain and rashes in multiple areas currently feeling very poorly after tapering down the prednisone since our last visit.  Lab testing at that visit redemonstrated a  positive ANA but was otherwise completely unremarkable including acute inflammatory markers and immunoglobulins.  Histamine release assay was also unremarkable.   Previous HPI 07/04/22 Chelsea Hill is a 46 y.o. female here for evaluation of positive ANA and multiple chronic symptoms including joint pain, fatigue, and rashes, and other problems with episodic flare ups.  She noticed problems new or severely increased when she quit smoking about 5 years ago.  She developed facial rashes and joint pain and swelling in multiple areas especially with bilateral knee swelling pain and decreased motion. Lived in [place] at the time had a evaluation at a local office then [facility].  She saw orthopedic surgery clinic with symptoms attributed to osteoarthritis.  Subsequently evaluated with rheumatology due to inflammatory changes and having recurrent tendinitis or enthesitis episodes.  X-ray and MRI imaging studies were apparently mostly unremarkable besides some degenerative changes.  Subsequently started breaking out in skin rashes throughout her torso with numerous isolated erythematous papules the subsequently involuted or flattened leaving behind small erythematous patches that resolved over a course of multiple weeks.  She had biopsy of the skin rash reports this was apparently nonspecific with some type of pathology or immunohistochemical staining suggestive for an autoimmune process but not definitive.  She also saw some scalp rash and generalized hair thinning evaluation for this also including biopsy reports this demonstrated telogen effluvium but was otherwise nonspecific.  Also found  to have Hashimoto's thyroiditis and started on low-dose levothyroxine supplementation.  She did not start any other disease specific long-term medications though intermittent use of steroids improve her symptoms dramatically.  She resumed cigarette smoking this resolved her symptoms pretty complet ely for about 9  months until they started to occur at a milder severity than at the onset. She moved to [place] about 2-1/2 years ago noticed new symptom with more severe erythematous facial rash including swelling over the nose and over upper eyelids even affecting her vision.  Lab testing has been mostly unremarkable with a repeat checked late last year showed a positive ANA with negative reflex antibody panel.  Symptoms continue to improve while on steroids but flareup again off of treatment.  Also found partial benefit with skin rashes adding oral antihistamine to low-dose prednisone.  Due to review found of symptoms every time she stopped she is now on prednisone 10 mg daily for the past 4 months Symptoms are fairly well-controlled today.  Most significant joint problem right now is that affecting her hips with tendinitis pain or greater trochanteric pain.  This has been an ongoing problem with previous involvement at epicondylitis on both elbows and Achilles tendinitis in both ankles.     Labs reviewed 01/2022 ANA 1:80 RF neg ESR wnl CRP wnl   10/2020 ANA neg   Review of Systems  Constitutional:  Positive for fatigue.  HENT:  Positive for mouth dryness. Negative for mouth sores.   Eyes:  Positive for dryness.  Respiratory:  Negative for shortness of breath.   Cardiovascular:  Negative for chest pain and palpitations.  Gastrointestinal:  Negative for blood in stool, constipation and diarrhea.  Endocrine: Negative for increased urination.  Genitourinary:  Positive for involuntary urination.  Musculoskeletal:  Positive for joint pain, joint pain, joint swelling, myalgias, muscle weakness, morning stiffness, muscle tenderness and myalgias. Negative for gait problem.  Skin:  Negative for color change, rash, hair loss and sensitivity to sunlight.  Allergic/Immunologic: Negative for susceptible to infections.  Neurological:  Negative for dizziness and headaches.  Hematological:  Negative for  swollen glands.  Psychiatric/Behavioral:  Positive for depressed mood and sleep disturbance. The patient is nervous/anxious.     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   Tobacco use disorder, continuous 11/25/2020   Hypothyroidism due to Hashimoto's thyroiditis 10/07/2020   Anxiety and depression 10/07/2020   Eyelid dermatitis, allergic/contact 10/07/2020    Past Medical History:  Diagnosis Date   Alcohol addiction (HCC)    Anxiety    Depression    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HCC)    Hashimoto's disease    Hyperlipidemia    Orofacial granulomatosis    Thyroid disease     Family History  Problem Relation Age of Onset   Lung cancer Mother    Alcoholism Mother    Hypertension Father    Heart disease Father    Hyperlipidemia Brother    Breast cancer Maternal Aunt    Past Surgical History:  Procedure Laterality Date  ABDOMINAL HYSTERECTOMY     BILATERAL CARPAL TUNNEL RELEASE Bilateral    SPINE SURGERY     Social History   Social History Narrative   Not on file   Immunization History  Administered Date(s) Administered   PFIZER(Purple Top)SARS-COV-2 Vaccination 12/17/2019, 01/07/2020     Objective: Vital Signs: BP 107/69 (BP Location: Left Arm, Patient Position: Sitting, Cuff Size: Normal)   Pulse 60   Resp 14   Ht 5\' 3"  (1.6 m)   Wt 124 lb (56.2 kg)   BMI 21.97 kg/m    Physical Exam Eyes:     Conjunctiva/sclera: Conjunctivae normal.  Cardiovascular:     Rate and Rhythm: Normal rate and  regular rhythm.  Pulmonary:     Effort: Pulmonary effort is normal.     Breath sounds: Normal breath sounds.  Musculoskeletal:     Right lower leg: No edema.     Left lower leg: No edema.  Lymphadenopathy:     Cervical: No cervical adenopathy.  Skin:    General: Skin is warm and dry.     Findings: No rash.  Neurological:     Mental Status: She is alert.  Psychiatric:        Mood and Affect: Mood normal.      Musculoskeletal Exam:  Shoulders full ROM no tenderness or swelling Elbows full ROM no tenderness or swelling Wrists full ROM no tenderness or swelling Fingers full ROM, tenderness to pressure at DIP joints of both hands without palpable swelling Knees full ROM no tenderness or swelling Ankles full ROM no tenderness or swelling  Limited musculoskeletal ultrasound exam in both hands suggestive for hyperemia at distal enthesis and growth plate, no visible joint effusion  Investigation: No additional findings.  Imaging: No results found.  Recent Labs: Lab Results  Component Value Date   WBC 8.0 10/09/2022   HGB 13.2 10/09/2022   PLT 322 10/09/2022   NA 138 10/09/2022   K 4.8 10/09/2022   CL 104 10/09/2022   CO2 26 10/09/2022   GLUCOSE 95 10/09/2022   BUN 17 10/09/2022   CREATININE 0.70 10/09/2022   BILITOT 0.3 10/09/2022   ALKPHOS 63 02/09/2022   AST 15 10/09/2022   ALT 11 10/09/2022   PROT 7.1 10/09/2022   ALBUMIN 4.8 02/09/2022   CALCIUM 9.7 10/09/2022   QFTBGOLDPLUS NEGATIVE 08/14/2022    Speciality Comments: No specialty comments available.  Procedures:  No procedures performed Allergies: Venlafaxine   Assessment / Plan:     Visit Diagnoses: Polyarthritis - Plan: Sedimentation rate, C-reactive protein, methotrexate 50 MG/2ML injection, TUBERCULIN SYR 1CC/26GX3/8" 26G X 3/8" 1 ML MISC  Joint pain in multiple areas there is minimal synovitis and serology has been normal but symptoms and ultrasound exam are suggestive for some ongoing tendinitis or  enthesitis process.  Also appears to be getting some DIP joint osteoarthritis early nodules.  Plan to stop sulfasalazine and start subcutaneous methotrexate for better GI tolerance.  Starting dose 10 mg subcu weekly and starting folic acid 1 mg daily.  High risk medication use - Sulfasalazine recommend her to try tapering the medicine partially go down to 500 mg a.m. 1000 mg p.m. - Plan: CBC with Differential/Platelet, COMPLETE METABOLIC PANEL WITH GFR  Checking CBC and CMP for medication monitoring now switching to methotrexate in place of sulfasalazine.  Has been tolerating medication okay from laboratory standpoint and no complications.  GI intolerance has been main limiting factor.  Reviewed risk of methotrexate including mucositis or skin rash, cytopenias,  hepatotoxicity.  She does not drink alcohol.  Rash  Currently well-controlled.  She is describing some ongoing nail changes but no pitting or significant dystrophy on exam today.  Still questionable for psoriasis for some other atopic or inflammatory process but not active on exam today.   Orders: Orders Placed This Encounter  Procedures   Sedimentation rate   CBC with Differential/Platelet   COMPLETE METABOLIC PANEL WITH GFR   C-reactive protein   Meds ordered this encounter  Medications   methotrexate 50 MG/2ML injection    Sig: Inject 0.4 mLs (10 mg total) into the skin once a week.    Dispense:  4 mL    Refill:  0    Okay to take alongside pantoprazole   TUBERCULIN SYR 1CC/26GX3/8" 26G X 3/8" 1 ML MISC    Sig: Use one weekly for injection of methotrexate    Dispense:  25 each    Refill:  0    Substitute for alternate 1mL syringe acceptable     Follow-Up Instructions: Return in about 6 weeks (around 01/22/2023) for Seronegative arthritia MTX switch f/u 6 wks.   Fuller Plan, MD  Note - This record has been created using [employer].  Chart creation errors have been sought, but may not always  have been  located. Such creation errors do not reflect on  the standard of medical care.

## 2022-12-11 ENCOUNTER — Ambulatory Visit: Payer: PRIVATE HEALTH INSURANCE | Attending: Internal Medicine | Admitting: Internal Medicine

## 2022-12-11 ENCOUNTER — Encounter: Payer: Self-pay | Admitting: Internal Medicine

## 2022-12-11 VITALS — BP 107/69 | HR 60 | Resp 14 | Ht 63.0 in | Wt 124.0 lb

## 2022-12-11 DIAGNOSIS — R21 Rash and other nonspecific skin eruption: Secondary | ICD-10-CM | POA: Diagnosis not present

## 2022-12-11 DIAGNOSIS — Z79899 Other long term (current) drug therapy: Secondary | ICD-10-CM

## 2022-12-11 DIAGNOSIS — M130 Polyarthritis, unspecified: Secondary | ICD-10-CM | POA: Diagnosis not present

## 2022-12-11 DIAGNOSIS — R768 Other specified abnormal immunological findings in serum: Secondary | ICD-10-CM | POA: Diagnosis not present

## 2022-12-11 LAB — CBC WITH DIFFERENTIAL/PLATELET
Absolute Monocytes: 601 cells/uL (ref 200–950)
Basophils Absolute: 40 cells/uL (ref 0–200)
Basophils Relative: 0.6 %
Eosinophils Absolute: 191 cells/uL (ref 15–500)
Eosinophils Relative: 2.9 %
HCT: 38.6 % (ref 35.0–45.0)
Hemoglobin: 12.8 g/dL (ref 11.7–15.5)
Lymphs Abs: 2336 cells/uL (ref 850–3900)
MCH: 30.5 pg (ref 27.0–33.0)
MCHC: 33.2 g/dL (ref 32.0–36.0)
MCV: 92.1 fL (ref 80.0–100.0)
MPV: 9.8 fL (ref 7.5–12.5)
Monocytes Relative: 9.1 %
Neutro Abs: 3432 cells/uL (ref 1500–7800)
Neutrophils Relative %: 52 %
Platelets: 350 10*3/uL (ref 140–400)
RBC: 4.19 10*6/uL (ref 3.80–5.10)
RDW: 12.5 % (ref 11.0–15.0)
Total Lymphocyte: 35.4 %
WBC: 6.6 10*3/uL (ref 3.8–10.8)

## 2022-12-11 LAB — SEDIMENTATION RATE: Sed Rate: 6 mm/h (ref 0–20)

## 2022-12-11 MED ORDER — FOLIC ACID 1 MG PO TABS: 1.0000 mg | ORAL_TABLET | Freq: Every day | ORAL | 0 refills | Status: DC

## 2022-12-11 MED ORDER — FOLIC ACID 1 MG PO TABS: 1.0000 mg | ORAL_TABLET | Freq: Every day | ORAL | Status: DC

## 2022-12-11 MED ORDER — METHOTREXATE SODIUM CHEMO INJECTION 50 MG/2ML: 10.0000 mg | INTRAMUSCULAR | 0 refills | Status: DC

## 2022-12-11 MED ORDER — "TUBERCULIN SYRINGE 26G X 3/8"" 1 ML MISC": 0 refills | Status: DC

## 2022-12-11 NOTE — Patient Instructions (Signed)
Methotrexate Injection What is this medication? METHOTREXATE (METH oh TREX ate) treats inflammatory conditions such as arthritis and psoriasis. It works by decreasing inflammation, which can reduce pain and prevent long-term injury to the joints and skin. It may also be used to treat some types of cancer. It works by slowing down the growth of cancer cells. This medicine may be used for other purposes; ask your health care provider or pharmacist if you have questions. What should I tell my care team before I take this medication? They need to know if you have any of these conditions: Fluid in the stomach area or lungs Frequently drink alcohol Infection or immune system problems Kidney disease Liver disease Low blood counts (white cells, platelets, or red blood cells) Lung disease Recent or ongoing radiation Recent or upcoming vaccine Stomach ulcers Ulcerative colitis An unusual or allergic reaction to methotrexate, other medications, foods, dyes, or preservatives Pregnant or trying to get pregnant Breastfeeding How should I use this medication? This medication is for infusion into a vein or for injection into muscle or into the spinal fluid (whichever applies). It is usually given in a hospital or clinic setting. In rare cases, you might get this medication at home. You will be taught how to give this medication. Use exactly as directed. Take your medication at regular intervals. Do not take your medication more often than directed. If this medication is used for arthritis or psoriasis, it should be taken weekly, NOT daily. It is important that you put your used needles and syringes in a special sharps container. Do not put them in a trash can. If you do not have a sharps container, call your pharmacist or care team to get one. Talk to your care team about the use of this medication in children. While this medication may be prescribed for children as young as 2 years for selected conditions,  precautions do apply. Overdosage: If you think you have taken too much of this medicine contact a poison control center or emergency room at once. NOTE: This medicine is only for you. Do not share this medicine with others. What if I miss a dose? It is important not to miss your dose. Call your care team if you are unable to keep an appointment. If you give yourself the medication, and you miss a dose, talk with your care team. Do not take double or extra doses. What may interact with this medication? Do not take this medication with any of the following: Acitretin Probenecid This medication may also interact with the following: Aspirin or aspirin-like medications Azathioprine Certain antibiotics, such as gentamicin, penicillin, tetracycline, vancomycin Certain medications that treat or prevent blood clots, such as warfarin, apixaban, dabigatran, rivaroxaban Certain medications for stomach problems, such as esomeprazole, omeprazole, pantoprazole Dapsone Hydroxychloroquine Live virus vaccines Medications for viral infections, such as acyclovir, cidofovir, foscarnet, ganciclovir Mercaptopurine NSAIDs, medications for pain and inflammation, such as ibuprofen or naproxen Phenytoin Pyrimethamine Retinoids, such as isotretinoin or tretinoin Sulfonamides, such as sulfasalazine or trimethoprim; sulfamethoxazole Theophylline This list may not describe all possible interactions. Give your health care provider a list of all the medicines, herbs, non-prescription drugs, or dietary supplements you use. Also tell them if you smoke, drink alcohol, or use illegal drugs. Some items may interact with your medicine. What should I watch for while using this medication? This medication may make you feel generally unwell. This is not uncommon as chemotherapy can affect healthy cells as well as cancer cells. Report any side effects.  Continue your course of treatment even though you feel ill unless your care  team tells you to stop. Your condition will be monitored carefully while you are receiving this medication. Avoid alcoholic drinks. This medication can cause serious side effects. To reduce the risk, your care team may give you other medications to take before receiving this one. Be sure to follow the directions from your care team. This medication can make you more sensitive to the sun. Keep out of the sun. If you cannot avoid being in the sun, wear protective clothing and use sunscreen. Do not use sun lamps or tanning beds/booths. You may get drowsy or dizzy. Do not drive, use machinery, or do anything that needs mental alertness until you know how this medication affects you. Do not stand or sit up quickly, especially if you are an older patient. This reduces the risk of dizzy or fainting spells. You may need blood work while you are taking this medication. Call your care team for advice if you get a fever, chills or sore throat, or other symptoms of a cold or flu. Do not treat yourself. This medication decreases your body's ability to fight infections. Try to avoid being around people who are sick. This medication may increase your risk to bruise or bleed. Call your care team if you notice any unusual bleeding. Be careful brushing or flossing your teeth or using a toothpick because you may get an infection or bleed more easily. If you have any dental work done, tell your dentist you are receiving this medication Check with your care team if you get an attack of severe diarrhea, nausea and vomiting, or if you sweat a lot. The loss of too much body fluid can make it dangerous for you to take this medication. Talk to your care team about your risk of cancer. You may be more at risk for certain types of cancers if you take this medication. Do not become pregnant while taking this medication or for 6 months after stopping it. Women should inform their care team if they wish to become pregnant or think  they might be pregnant. Men should not father a child while taking this medication and for 3 months after stopping it. There is potential for serious harm to an unborn child. Talk to your care team for more information. Do not breast-feed an infant while taking this medication or for 1 week after stopping it. This medication may make it more difficult to get pregnant or father a child. Talk to your care team if you are concerned about your fertility. What side effects may I notice from receiving this medication? Side effects that you should report to your care team as soon as possible: Allergic reactions--skin rash, itching, hives, swelling of the face, lips, tongue, or throat Blood clot--pain, swelling, or warmth in the leg, shortness of breath, chest pain Dry cough, shortness of breath or trouble breathing Infection--fever, chills, cough, sore throat, wounds that don't heal, pain or trouble when passing urine, general feeling of discomfort or being unwell Kidney injury--decrease in the amount of urine, swelling of the ankles, hands, or feet Liver injury--right upper belly pain, loss of appetite, nausea, light-colored stool, dark yellow or brown urine, yellowing of the skin or eyes, unusual weakness or fatigue Low red blood cell count--unusual weakness or fatigue, dizziness, headache, trouble breathing Redness, blistering, peeling, or loosening of the skin, including inside the mouth Seizures Unusual bruising or bleeding Side effects that usually do not require medical  attention (report to your care team if they continue or are bothersome): Diarrhea Dizziness Hair loss Nausea Pain, redness, or swelling with sores inside the mouth or throat Vomiting This list may not describe all possible side effects. Call your doctor for medical advice about side effects. You may report side effects to FDA at 1-800-FDA-1088. Where should I keep my medication? This medication is given in a hospital or clinic.  It will not be stored at home. NOTE: This sheet is a summary. It may not cover all possible information. If you have questions about this medicine, talk to your doctor, pharmacist, or health care provider.  2024 [employer]vier/Gold Standard (2022-09-20 00:00:00)

## 2022-12-21 ENCOUNTER — Ambulatory Visit: Payer: PRIVATE HEALTH INSURANCE | Admitting: Nurse Practitioner

## 2022-12-21 ENCOUNTER — Encounter: Payer: Self-pay | Admitting: Nurse Practitioner

## 2022-12-21 VITALS — BP 120/74 | HR 73 | Wt 122.4 lb

## 2022-12-21 DIAGNOSIS — L404 Guttate psoriasis: Secondary | ICD-10-CM

## 2022-12-21 DIAGNOSIS — R768 Other specified abnormal immunological findings in serum: Secondary | ICD-10-CM

## 2022-12-21 DIAGNOSIS — E038 Other specified hypothyroidism: Secondary | ICD-10-CM

## 2022-12-21 DIAGNOSIS — F32A Depression, unspecified: Secondary | ICD-10-CM

## 2022-12-21 DIAGNOSIS — E063 Autoimmune thyroiditis: Secondary | ICD-10-CM

## 2022-12-21 DIAGNOSIS — M130 Polyarthritis, unspecified: Secondary | ICD-10-CM

## 2022-12-21 DIAGNOSIS — F413 Other mixed anxiety disorders: Secondary | ICD-10-CM

## 2022-12-21 DIAGNOSIS — Z79899 Other long term (current) drug therapy: Secondary | ICD-10-CM

## 2022-12-21 DIAGNOSIS — F419 Anxiety disorder, unspecified: Secondary | ICD-10-CM | POA: Diagnosis not present

## 2022-12-21 MED ORDER — ONDANSETRON 8 MG PO TBDP: 8.0000 mg | ORAL_TABLET | Freq: Three times a day (TID) | ORAL | 3 refills | Status: AC | PRN

## 2022-12-21 MED ORDER — FLUOXETINE HCL 20 MG PO TABS: 20.0000 mg | ORAL_TABLET | Freq: Every day | ORAL | 3 refills | Status: DC

## 2022-12-21 MED ORDER — LEVOTHYROXINE SODIUM 25 MCG PO TABS: 25.0000 ug | ORAL_TABLET | Freq: Every day | ORAL | 3 refills | Status: AC

## 2022-12-21 NOTE — Patient Instructions (Addendum)
We will keep the option of Vraylar in our back pocket if the depression doesn't seem to be improving.   Let me know if the hot flashes get worse and the medication you are taking is no longer working. We can always trial Veozah.  When everything settles down we will plan a DEXA bone density to make sure that your bones look ok.

## 2022-12-21 NOTE — Progress Notes (Signed)
Shawna Clamp, DNP, AGNP-c [facility]  [address] [place], [place] [address] [phone]  ESTABLISHED PATIENT- Chronic Health and/or Follow-Up Visit  Blood pressure 120/74, pulse 73, weight 122 lb 6.4 oz (55.5 kg).    Chelsea Hill is a 47 y.o. year old female presenting today for evaluation and management of chronic conditions.   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 She was diagnosed with gutate psoriasis and started the methotrexate. She tells me she has had 2 weeks on the current treatment and  she has started to have improvement of her symptoms already. She expresses relief with a diagnosis and hope for the first time in a long time for resolution of her symptoms.   She reports that she has had a significant emotional reaction to everything that is happening. She feels like she is going through menopause at the same time. She endorses a variety of emotions present. She is currently managed with prozac and feels this has been extremely beneficial for her. Even with the emotional reactions, she does not wish to change the medication. She is working through her emotions at this time and denies any alarm symptoms.   She reports good control of her thryoid and tolerating her medication.   All ROS negative with exception of what is listed above.   PHYSICAL EXAM Physical Exam Vitals and nursing note reviewed.  Constitutional:      General: She is not in acute distress.    Appearance: Normal appearance.  HENT:     Head: Normocephalic.  Eyes:     Conjunctiva/sclera: Conjunctivae normal.  Neck:     Vascular: No carotid bruit.  Cardiovascular:     Rate and Rhythm: Normal rate and regular rhythm.     Pulses: Normal pulses.     Heart sounds: Normal heart sounds. No murmur heard. Pulmonary:     Effort: Pulmonary effort is normal.     Breath sounds: Normal breath sounds.  Musculoskeletal:     Right  lower leg: No edema.     Left lower leg: No edema.  Skin:    General: Skin is warm and dry.     Capillary Refill: Capillary refill takes less than 2 seconds.     Findings: No erythema or rash.  Neurological:     General: No focal deficit present.     Mental Status: She is alert and oriented to person, place, and time.     Motor: No weakness.  Psychiatric:        Mood and Affect: Mood normal.     PLAN Problem List Items Addressed This Visit     Hypothyroidism due to Hashimoto's thyroiditis    Chronic. Stable. No alarm symptoms. Will monitor labs today. No changes in therapy at this time.       Relevant Medications   FLUoxetine (PROZAC) 20 MG tablet   ondansetron (ZOFRAN-ODT) 8 MG disintegrating tablet   levothyroxine (SYNTHROID) 25 MCG tablet   Other Relevant Orders   TSH (Completed)   T4, free (Completed)   VITAMIN D 25 Hydroxy (Vit-D Deficiency, Fractures) (Completed)   Vitamin B12 (Completed)   Anxiety and depression    Chronic. Recent emotional changes associated with finally getting a diagnosis for her chronic condition and started on therapy. We discussed the normal response to this and recommendations for moving forward. We will continue her current treatment and monitor closely. She will let me know if she has any additional  changes.       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 She is currently managing with methotrexate and has noted positive changes. I am thrilled that she has a diagnosis and relief of her symptoms without the use of steroids. I will continue to follow closely and provide supportive  care for ongoing needs and therapy.      Polyarthritis   Relevant Medications   FLUoxetine (PROZAC) 20 MG tablet   ondansetron (ZOFRAN-ODT) 8 MG disintegrating tablet   levothyroxine (SYNTHROID) 25 MCG tablet   Other Relevant Orders   TSH (Completed)   T4, free (Completed)   VITAMIN D 25 Hydroxy (Vit-D Deficiency, Fractures) (Completed)   Vitamin B12 (Completed)   Positive ANA (antinuclear antibody) - Primary   Relevant Medications   FLUoxetine (PROZAC) 20 MG tablet   ondansetron (ZOFRAN-ODT) 8 MG disintegrating tablet   levothyroxine (SYNTHROID) 25 MCG tablet   Other Relevant Orders   TSH (Completed)   T4, free (Completed)   VITAMIN D 25 Hydroxy (Vit-D Deficiency, Fractures) (Completed)   Vitamin B12 (Completed)   Other Visit Diagnoses     Other mixed anxiety disorders       Relevant Medications   FLUoxetine (PROZAC) 20 MG tablet   ondansetron (ZOFRAN-ODT) 8 MG disintegrating tablet   levothyroxine (SYNTHROID) 25 MCG tablet   Other Relevant Orders   TSH (Completed)   T4, free (Completed)   VITAMIN D 25 Hydroxy (Vit-D Deficiency, Fractures) (Completed)   Vitamin B12 (Completed)   High risk medication use       Relevant Medications   FLUoxetine (PROZAC) 20 MG tablet   ondansetron (ZOFRAN-ODT) 8 MG disintegrating tablet   levothyroxine (SYNTHROID) 25 MCG tablet   Other Relevant Orders   TSH (Completed)   T4, free (Completed)   VITAMIN D 25 Hydroxy (Vit-D Deficiency, Fractures) (Completed)   Vitamin B12 (Completed)       Return in about 6 months (around 06/23/2023) for CPE.  Time: 33 minutes, >50% spent counseling, care coordination, chart review, and documentation.   Shawna Clamp, DNP, AGNP-c

## 2022-12-22 LAB — VITAMIN D 25 HYDROXY (VIT D DEFICIENCY, FRACTURES): Vit D, 25-Hydroxy: 29.2 ng/mL — ABNORMAL LOW (ref 30.0–100.0)

## 2022-12-22 LAB — T4, FREE: Free T4: 1.13 ng/dL (ref 0.82–1.77)

## 2022-12-22 LAB — TSH: TSH: 2.03 u[IU]/mL (ref 0.450–4.500)

## 2022-12-22 LAB — VITAMIN B12: Vitamin B-12: 834 pg/mL (ref 232–1245)

## 2022-12-27 ENCOUNTER — Other Ambulatory Visit: Payer: Self-pay | Admitting: Nurse Practitioner

## 2022-12-27 DIAGNOSIS — E559 Vitamin D deficiency, unspecified: Secondary | ICD-10-CM

## 2022-12-27 MED ORDER — VITAMIN D (ERGOCALCIFEROL) 1.25 MG (50000 UNIT) PO CAPS: 50000.0000 [IU] | ORAL_CAPSULE | ORAL | 3 refills | Status: AC

## 2022-12-30 DIAGNOSIS — L404 Guttate psoriasis: Secondary | ICD-10-CM | POA: Insufficient documentation

## 2022-12-30 NOTE — Assessment & Plan Note (Signed)
Chronic. Stable. No alarm symptoms. Will monitor labs today. No changes in therapy at this time.

## 2022-12-30 NOTE — Assessment & Plan Note (Signed)
Chronic. Recent emotional changes associated with finally getting a diagnosis for her chronic condition and started on therapy. We discussed the normal response to this and recommendations for moving forward. We will continue her current treatment and monitor closely. She will let me know if she has any additional changes.

## 2022-12-30 NOTE — Assessment & Plan Note (Signed)
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 She is currently managing with methotrexate and has noted positive changes. I am thrilled that she has a diagnosis and relief of her symptoms without the use of steroids. I will continue to follow closely and provide supportive care for ongoing needs and therapy.

## 2023-01-22 ENCOUNTER — Ambulatory Visit: Payer: PRIVATE HEALTH INSURANCE | Attending: Internal Medicine | Admitting: Internal Medicine

## 2023-01-22 ENCOUNTER — Encounter: Payer: Self-pay | Admitting: Internal Medicine

## 2023-01-22 VITALS — BP 121/73 | HR 59 | Resp 15 | Ht 63.0 in | Wt 124.0 lb

## 2023-01-22 DIAGNOSIS — M130 Polyarthritis, unspecified: Secondary | ICD-10-CM

## 2023-01-22 DIAGNOSIS — Z79899 Other long term (current) drug therapy: Secondary | ICD-10-CM | POA: Diagnosis not present

## 2023-01-22 DIAGNOSIS — R21 Rash and other nonspecific skin eruption: Secondary | ICD-10-CM

## 2023-01-23 LAB — CBC WITH DIFFERENTIAL/PLATELET
Absolute Monocytes: 512 cells/uL (ref 200–950)
Basophils Absolute: 49 cells/uL (ref 0–200)
Basophils Relative: 0.8 %
Eosinophils Absolute: 232 cells/uL (ref 15–500)
Eosinophils Relative: 3.8 %
HCT: 39.1 % (ref 35.0–45.0)
Hemoglobin: 12.8 g/dL (ref 11.7–15.5)
Lymphs Abs: 2202 cells/uL (ref 850–3900)
MCH: 30.7 pg (ref 27.0–33.0)
MCHC: 32.7 g/dL (ref 32.0–36.0)
MCV: 93.8 fL (ref 80.0–100.0)
MPV: 9.7 fL (ref 7.5–12.5)
Monocytes Relative: 8.4 %
Neutro Abs: 3105 cells/uL (ref 1500–7800)
Neutrophils Relative %: 50.9 %
Platelets: 364 10*3/uL (ref 140–400)
RBC: 4.17 10*6/uL (ref 3.80–5.10)
RDW: 12.9 % (ref 11.0–15.0)
Total Lymphocyte: 36.1 %
WBC: 6.1 10*3/uL (ref 3.8–10.8)

## 2023-01-23 LAB — SEDIMENTATION RATE: Sed Rate: 6 mm/h (ref 0–20)

## 2023-01-23 LAB — COMPLETE METABOLIC PANEL WITH GFR
AG Ratio: 1.9 (calc) (ref 1.0–2.5)
ALT: 9 U/L (ref 6–29)
AST: 12 U/L (ref 10–35)
Albumin: 4.5 g/dL (ref 3.6–5.1)
Alkaline phosphatase (APISO): 67 U/L (ref 31–125)
BUN: 11 mg/dL (ref 7–25)
CO2: 29 mmol/L (ref 20–32)
Calcium: 9.6 mg/dL (ref 8.6–10.2)
Chloride: 105 mmol/L (ref 98–110)
Creat: 0.68 mg/dL (ref 0.50–0.99)
Globulin: 2.4 g/dL (calc) (ref 1.9–3.7)
Glucose, Bld: 95 mg/dL (ref 65–99)
Potassium: 4.4 mmol/L (ref 3.5–5.3)
Sodium: 140 mmol/L (ref 135–146)
Total Bilirubin: 0.5 mg/dL (ref 0.2–1.2)
Total Protein: 6.9 g/dL (ref 6.1–8.1)
eGFR: 107 mL/min/{1.73_m2} (ref >=60)

## 2023-01-23 NOTE — Progress Notes (Signed)
Blood count and kidney and liver function are normal no problems from the methotrexate.  She can try increasing the dose by a small amount like we discussed to 12.5 mg weekly (0.5 mL).

## 2023-01-24 ENCOUNTER — Other Ambulatory Visit: Payer: Self-pay | Admitting: *Deleted

## 2023-01-24 DIAGNOSIS — M130 Polyarthritis, unspecified: Secondary | ICD-10-CM

## 2023-01-24 MED ORDER — METHOTREXATE SODIUM CHEMO INJECTION 50 MG/2ML: 12.5000 mg | INTRAMUSCULAR | Status: DC

## 2023-02-09 ENCOUNTER — Other Ambulatory Visit: Payer: Self-pay | Admitting: Internal Medicine

## 2023-02-09 DIAGNOSIS — M130 Polyarthritis, unspecified: Secondary | ICD-10-CM

## 2023-02-09 NOTE — Telephone Encounter (Signed)
Last Fill: 12/11/2022  Labs: 01/22/2023 Blood count and kidney and liver function are normal no problems from the methotrexate.   Next Visit: 04/03/2023  Last Visit: 01/22/2023  DX: Joint inflammation   Current Dose per office note 01/22/2023: methotrexate up to 12.5 mg subcu weekly   Okay to refill Methotrexate?

## 2023-03-21 IMAGING — CR DG HIP (WITH OR WITHOUT PELVIS) 2-3V*L*
3 series · 3 of 3 positions shown · non-contrast
Comparison: None.

CLINICAL DATA: Coccyx pain. Feels "like there was a baseball" under
tailbone and on left lateral hip with lying supine on left side for
the past 8 months.

EXAM:
DG HIP (WITH OR WITHOUT PELVIS) 2-3V LEFT; SACRUM AND COCCYX - 2+
VIEW

[t pelvis a.p.]
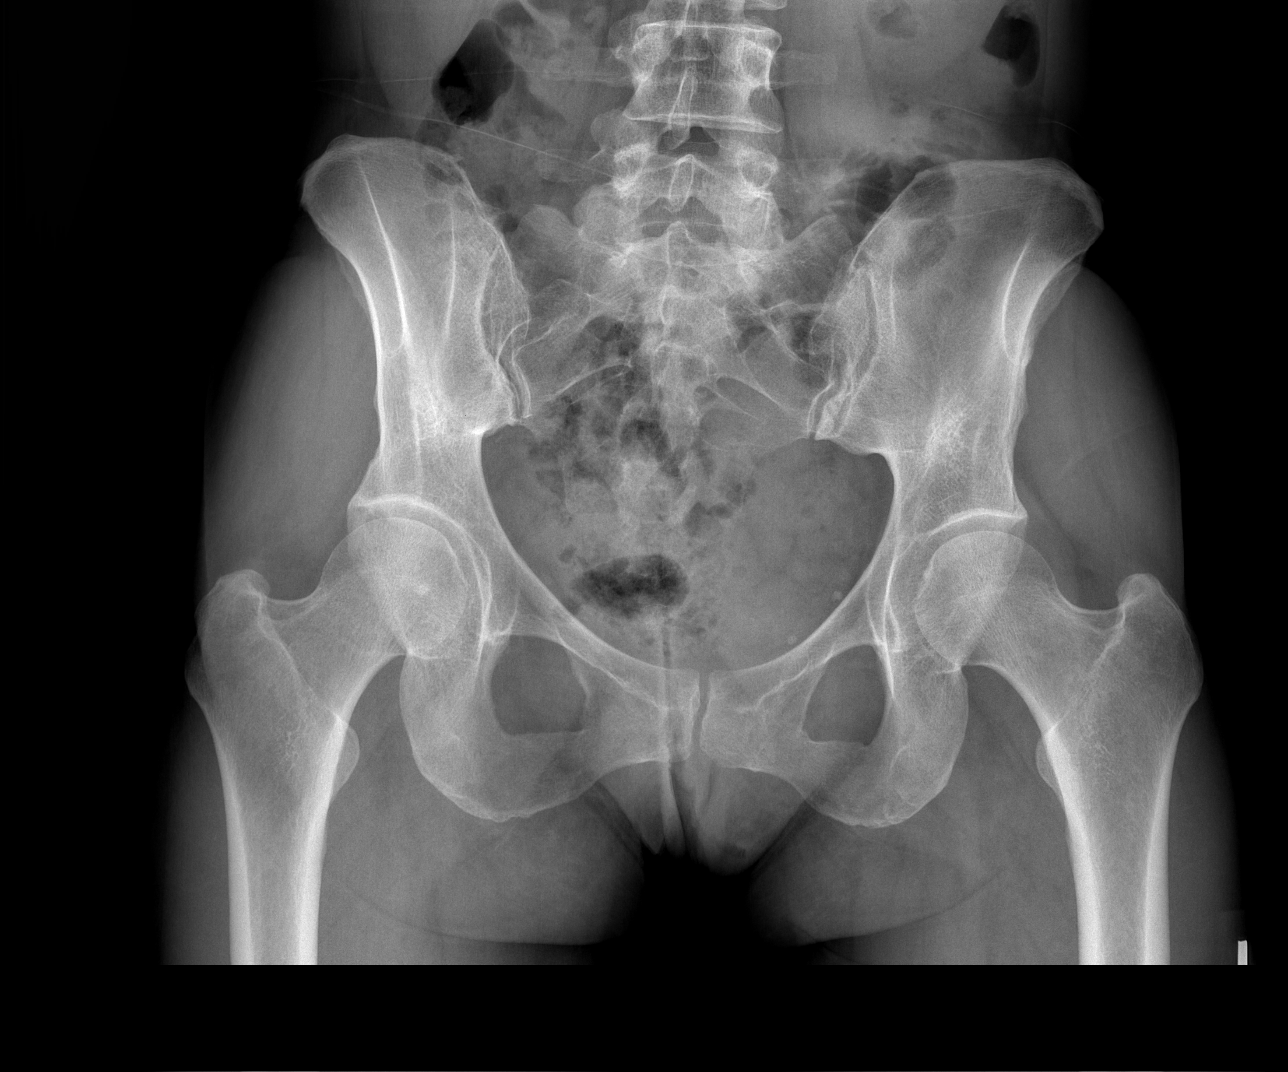

[t hip ap left]
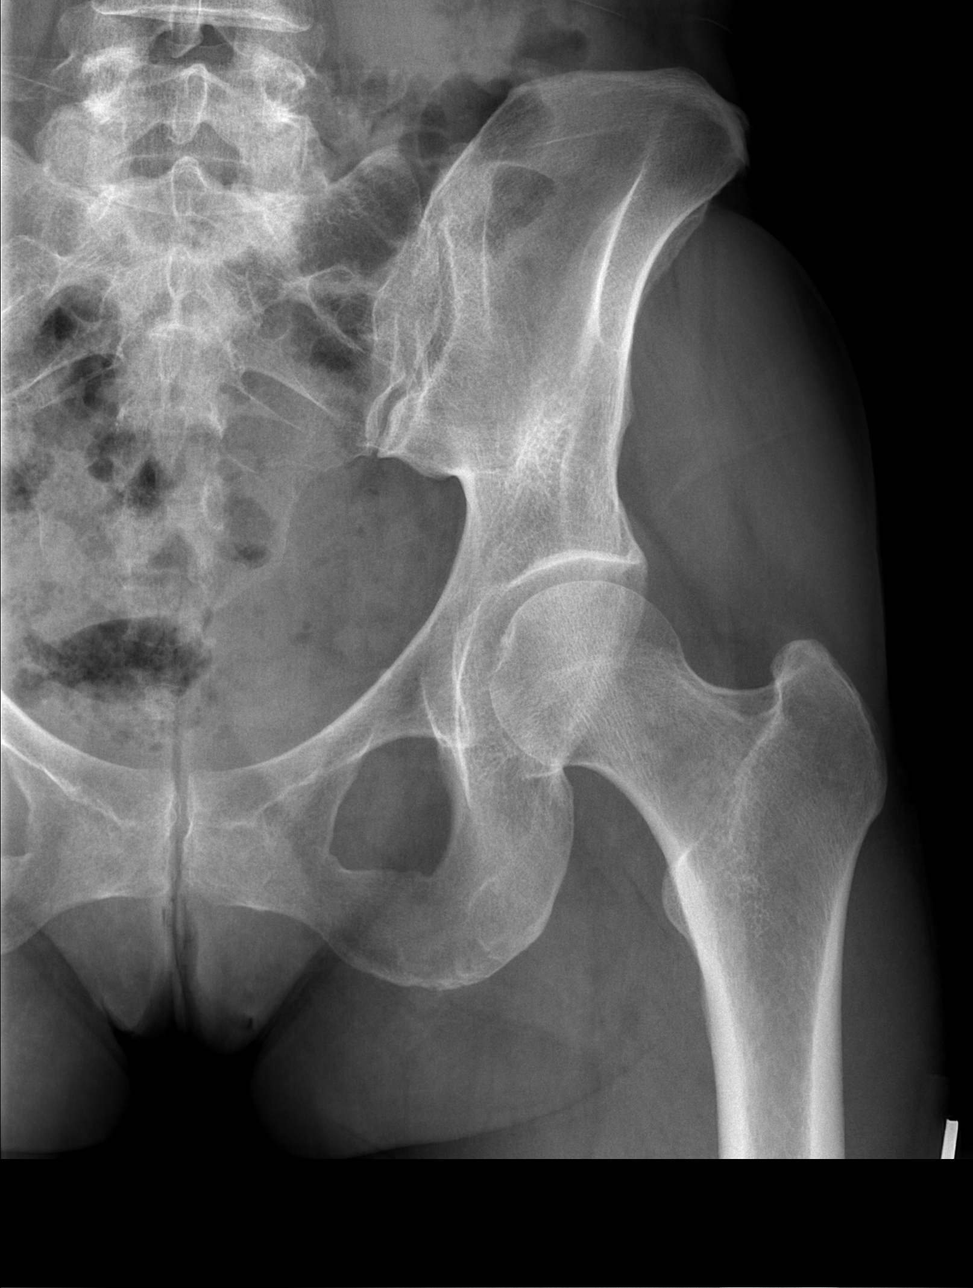

[t hip frog leg left]
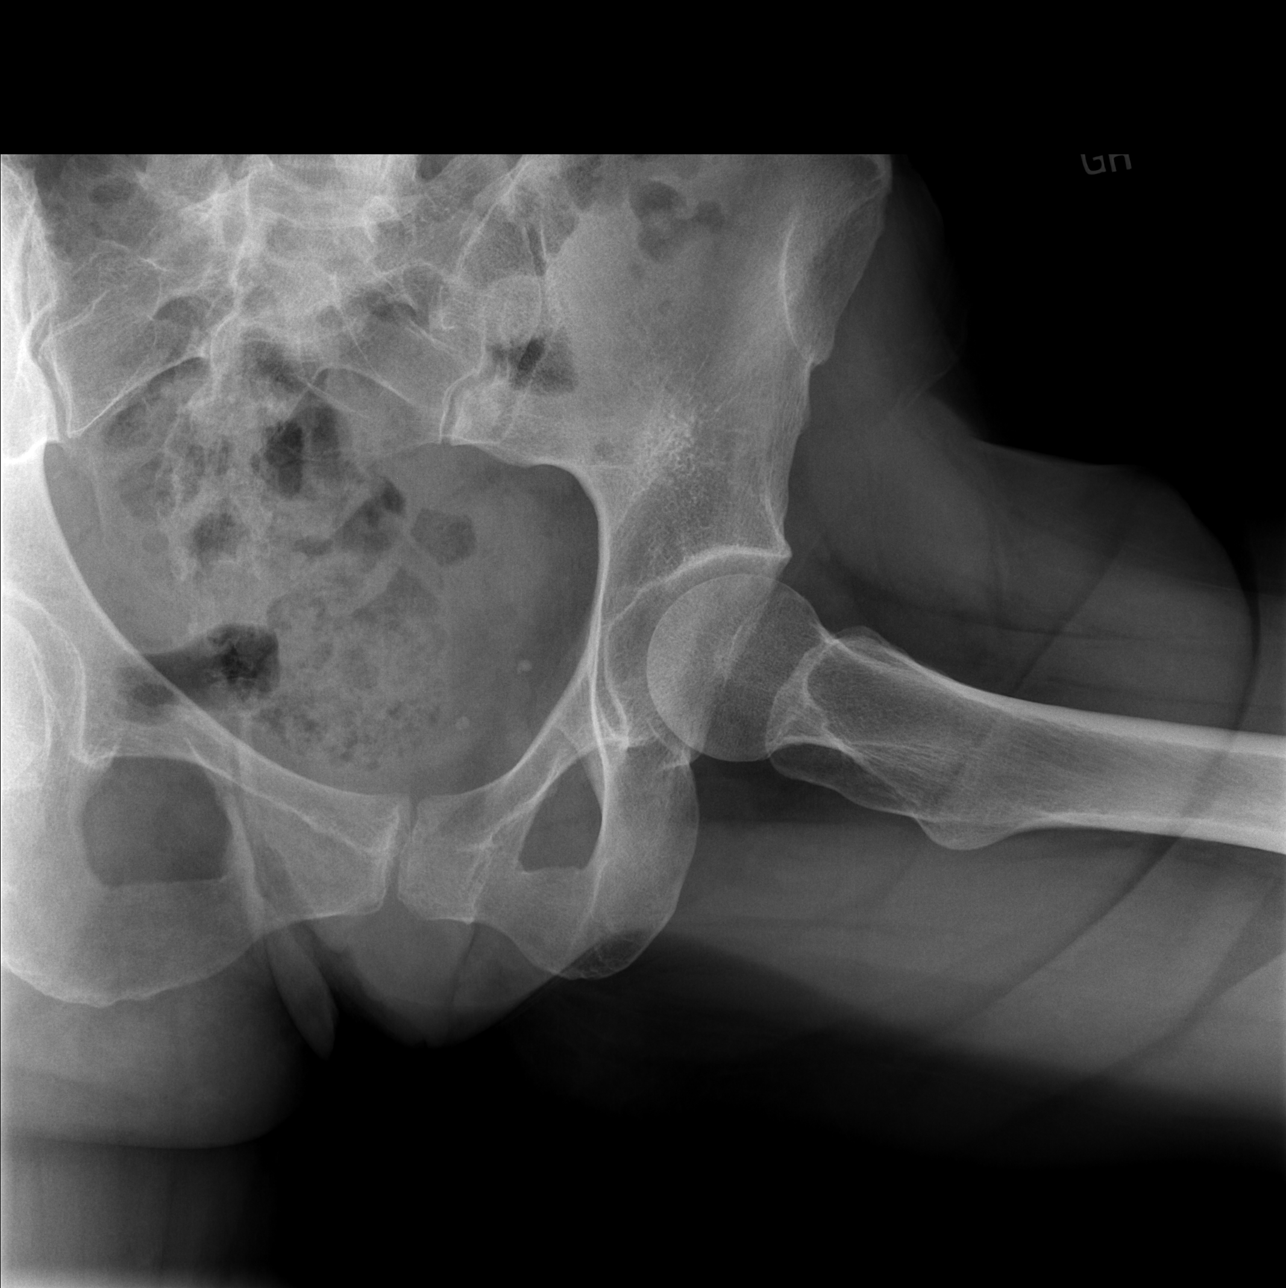

[3 of 3 positions shown; findings below may reference images not displayed]

FINDINGS: Pelvis and left hip:

The bilateral sacroiliac common bilateral femoroacetabular and pubic
symphysis joint spaces are maintained. No acute fracture or
dislocation.

Sacrum and coccyx:

Stool overlies the mid to inferior sacrum on frontal view. Normal
alignment of the sacrum and coccyx on lateral view. No definite
acute fracture is seen.
IMPRESSION: :
IMPRESSION: 1. No acute fracture is seen within the pelvis including the sacrum
and coccyx.
2. No significant osteoarthritis.

## 2023-03-21 IMAGING — CR DG SACRUM/COCCYX 2+V
3 series · 3 of 3 positions shown · non-contrast
Comparison: None.

CLINICAL DATA: Coccyx pain. Feels "like there was a baseball" under
tailbone and on left lateral hip with lying supine on left side for
the past 8 months.

EXAM:
DG HIP (WITH OR WITHOUT PELVIS) 2-3V LEFT; SACRUM AND COCCYX - 2+
VIEW

[t sacrum a.p. *]
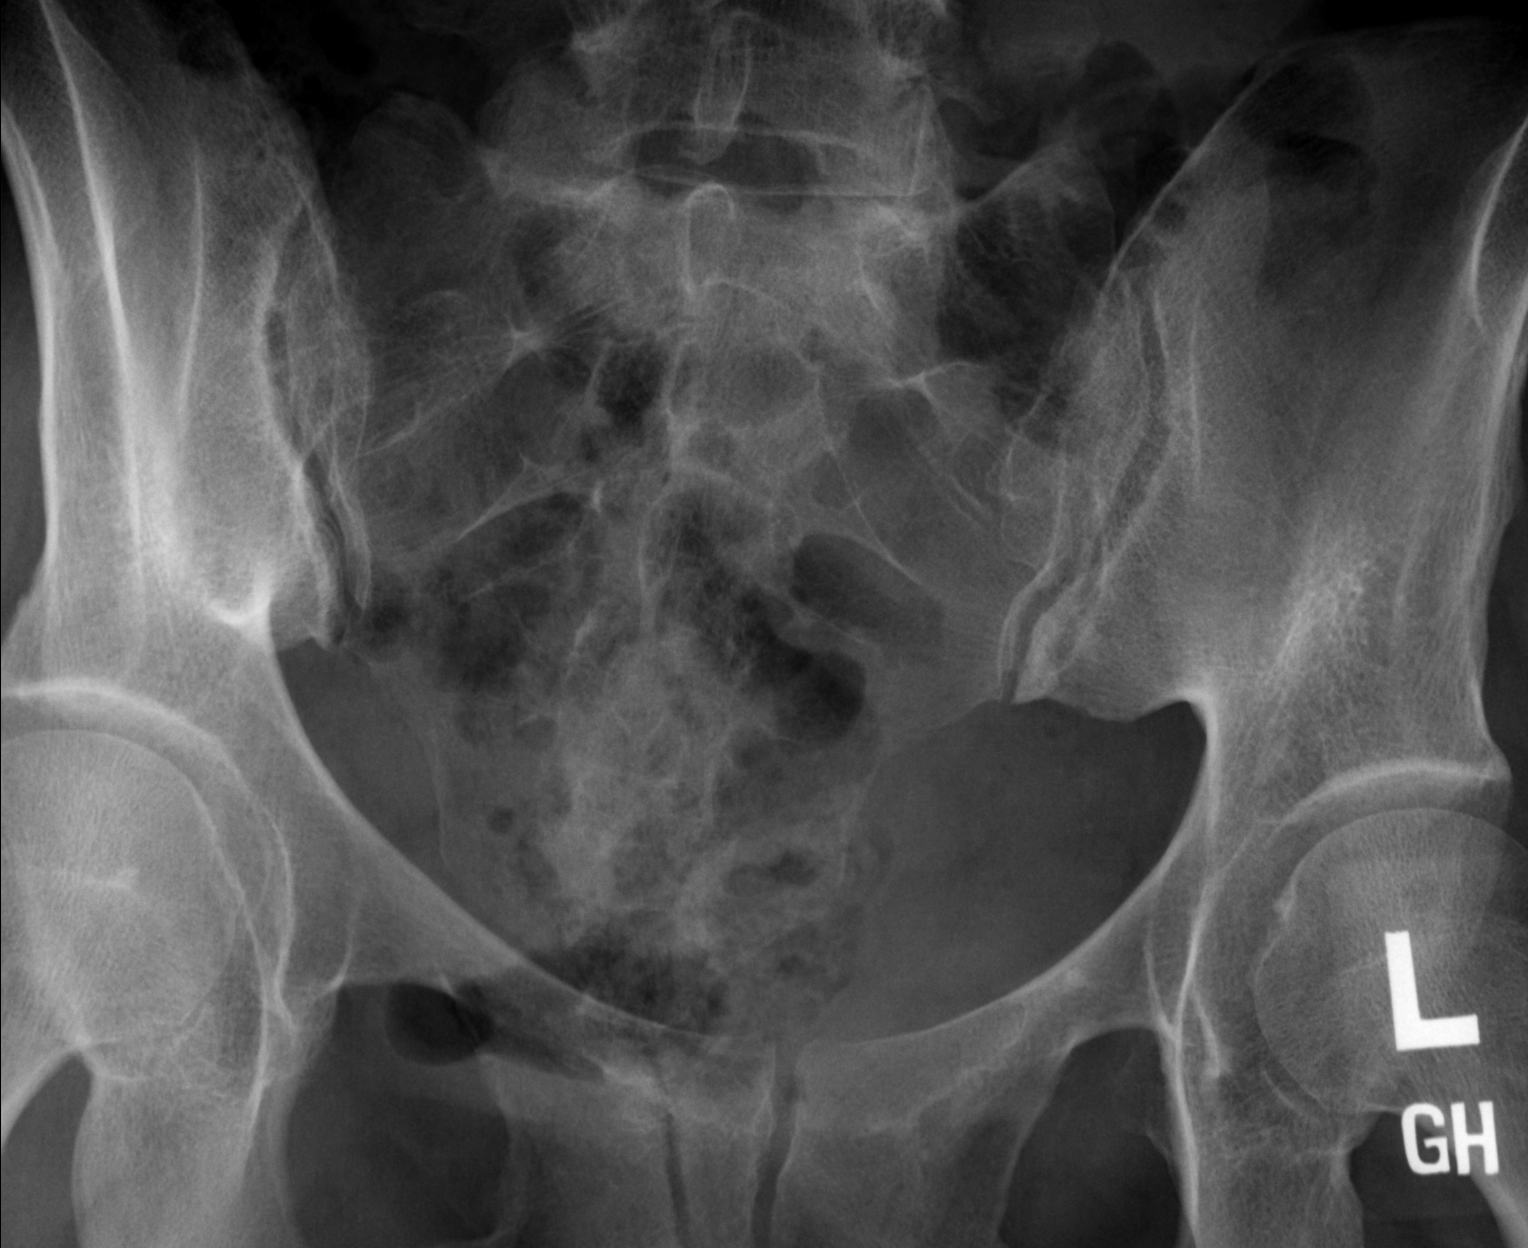

[t coccyx a.p. *]
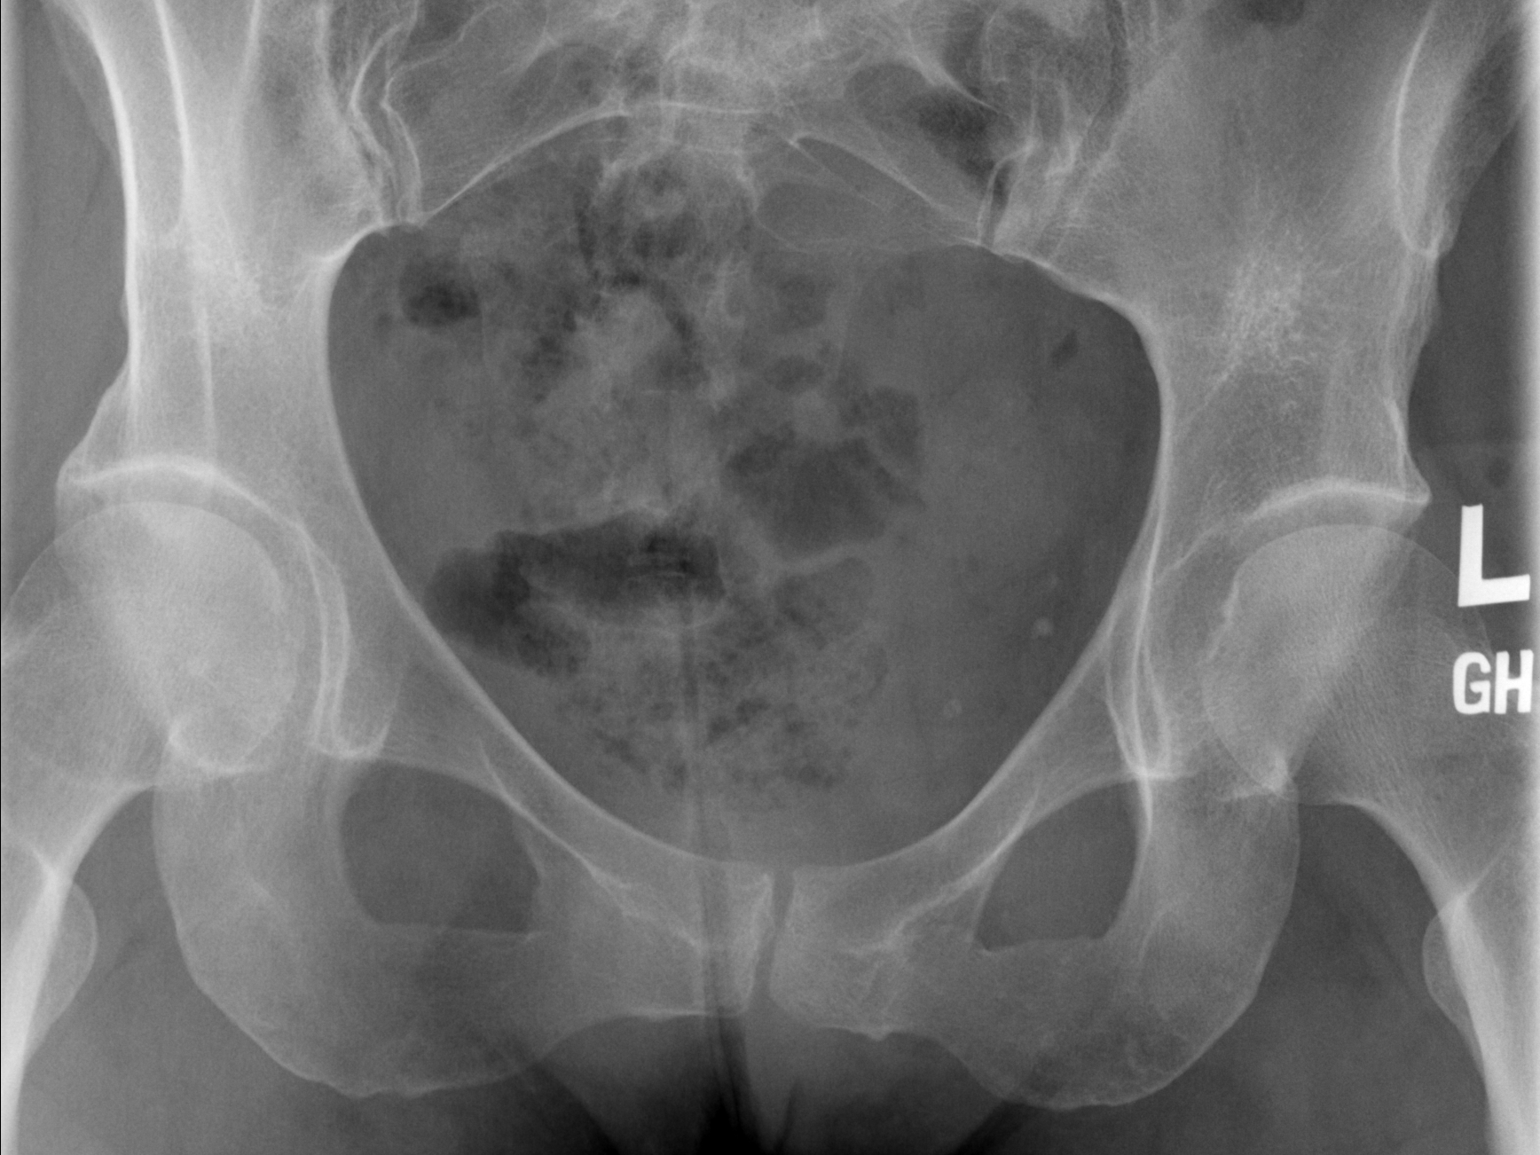

[t sacrum lat *]
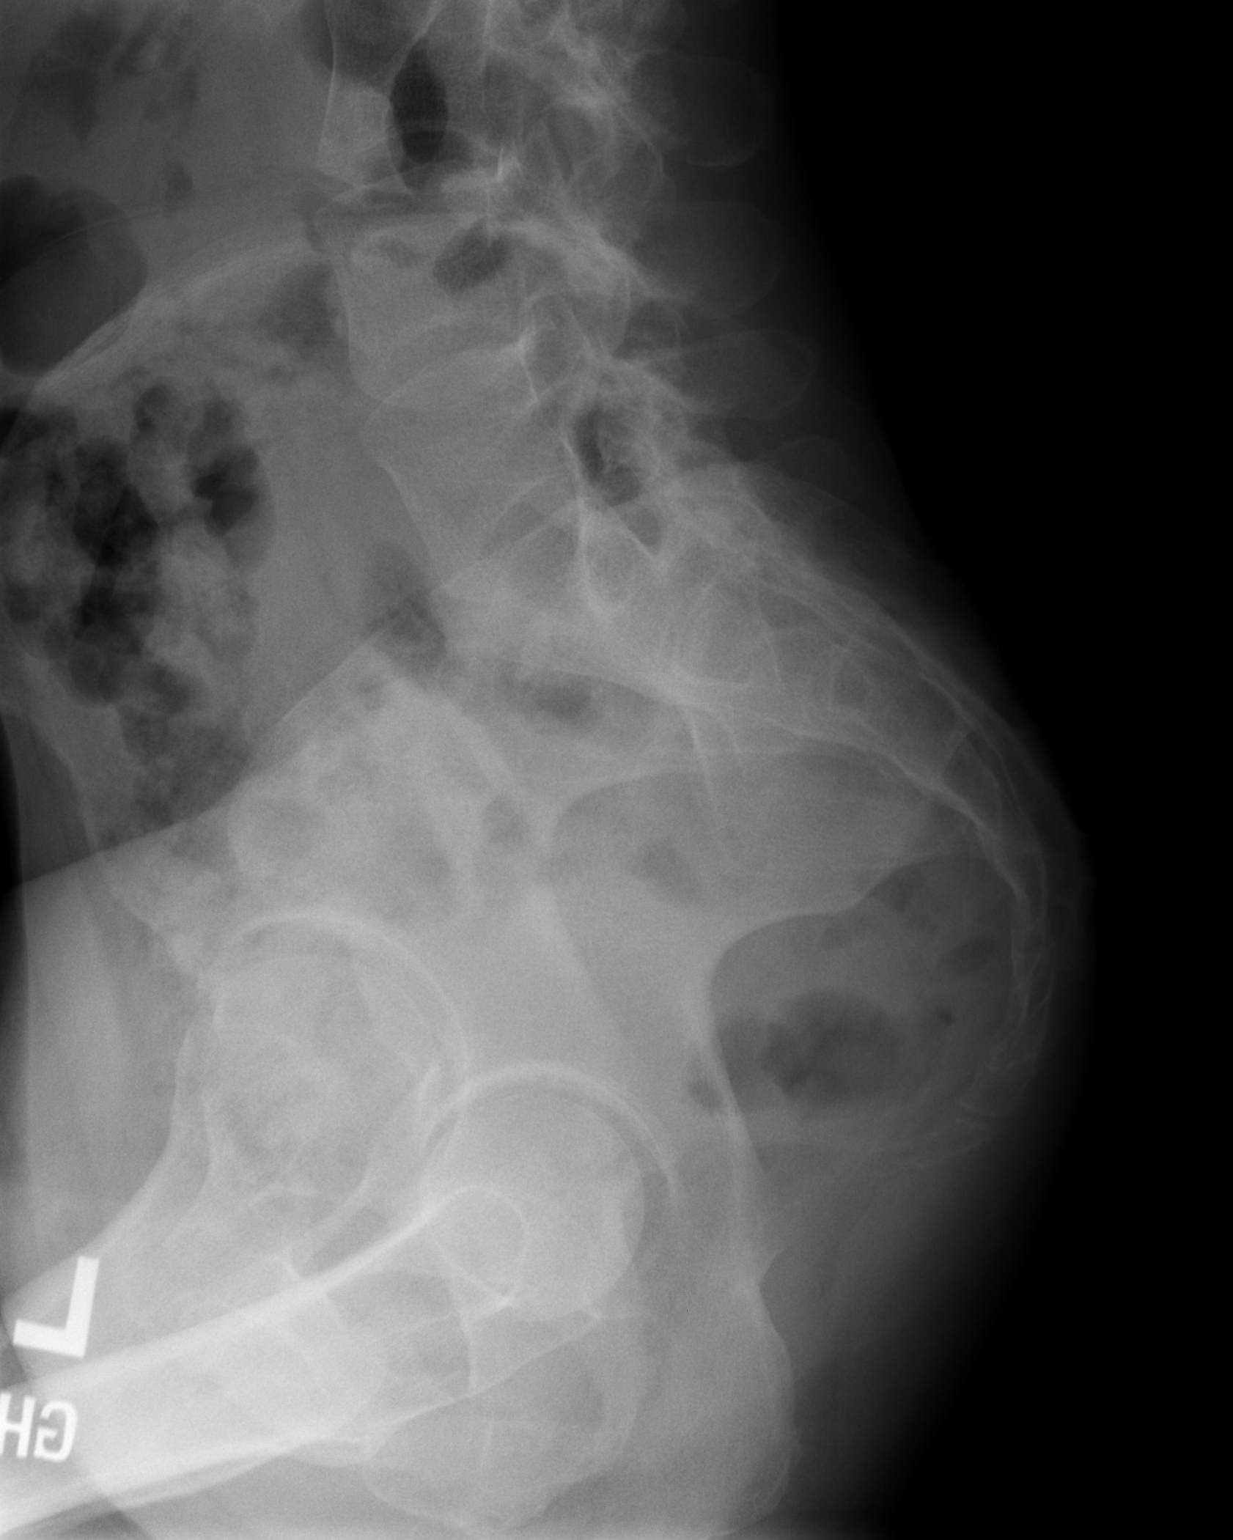

[3 of 3 positions shown; findings below may reference images not displayed]

FINDINGS: Pelvis and left hip:

The bilateral sacroiliac common bilateral femoroacetabular and pubic
symphysis joint spaces are maintained. No acute fracture or
dislocation.

Sacrum and coccyx:

Stool overlies the mid to inferior sacrum on frontal view. Normal
alignment of the sacrum and coccyx on lateral view. No definite
acute fracture is seen.
IMPRESSION: :
IMPRESSION: 1. No acute fracture is seen within the pelvis including the sacrum
and coccyx.
2. No significant osteoarthritis.

## 2023-03-21 NOTE — Progress Notes (Signed)
Office Visit Note  Patient: Chelsea Hill             Date of Birth: 02/08/1976           MRN: [id number]             PCP: Tollie Eth, NP Referring: Tollie Eth, NP Visit Date: 04/03/2023   Subjective:  Follow-up (Patient states she has a new lump on her right hand. Patient states the lump has been there for about five days. )   Discussed the use of AI scribe software for clinical note transcription with the patient, who gave verbal consent to proceed.  History of Present Illness   Chelsea Hill is a 47 y.o. 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  Presents with a new lump in the palm of their hand that developed subtly over the past couple of months.  She for started noticing it since about 5 days after her last clinic appointment.  The lump, initially painless, has started to cause discomfort when pressure is applied or when the patient attempts to pick up objects. The patient also reports a sensation of the lump spreading towards the adjacent fingers.  In addition to the lump, the patient reports dissatisfaction with their current methotrexate treatment. They express concerns about the medication's efficacy, noting that it seems to trigger flares some weeks. The patient also experienced a severe episode of nausea and vomiting, which they suspect might be related to the methotrexate. They report worsening arthritis symptoms, particularly in the knees, feet, and hands. She needs need to take ibuprofen 600 mg as needed for breakthrough joint pain estimates this 6 times per week.  Previous HPI 01/22/2023 Chelsea Hill is a 47 y.o. 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 She started methotrexate 10 mg [place] weekly since last visit feels her inflammation and distal finger nodules worsened initially before stabilizing but in the  last week feels symptoms are partially improved. Noticed 4 days of improvement after methotrexate injection then worsening in joints and skin itching. She is not seeing new rashes break out but gets intense itching that she associates preceding her rashes usually, and a few spots appear after scratching.   Previous HPI 12/11/2022 Chelsea Hill is a 47 y.o. 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  Currently tolerating taking 2 tablets at night only.  She reports a definite change in symptoms while on sulfasalazine partial improvement in joint pain and stiffness and her rashes are well-controlled.  When stopping the medicine entirely for 4 days or longer started to notice rash coming back in multiple areas including very itchy inflammation on the scalp.  She had to mild viral upper respiratory infections since our last visit no complications.   Previous HPI 10/09/2022 Chelsea Hill is a 47 y.o. 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 She has seen good improvement in skin rash and pleuritic chest pain symptoms are currently resolved. Joint pains including hips which were worst are about 75% better right now. She notices nausea every day while taking 1000 mg BID dose, but  was tolerating 1 tablet BID without issue. Sometimes skips a dose when having worse upset. No other side effect noted. Still fatigued, maybe 25% better not as much as her joint issues.   Previous HPI 08/14/22 Chelsea Hill is a 47 y.o. female here for follow up for her joint pain and rashes in multiple areas currently feeling very poorly after tapering down the prednisone since our last visit.  Lab testing at that visit  redemonstrated a positive ANA but was otherwise completely unremarkable including acute inflammatory markers and immunoglobulins.  Histamine release assay was also unremarkable.   Previous HPI 07/04/22 Chelsea Hill is a 47 y.o. female here for evaluation of positive ANA and multiple chronic symptoms including joint pain, fatigue, and rashes, and other problems with episodic flare ups.  She noticed problems new or severely increased when she quit smoking about 5 years ago.  She developed facial rashes and joint pain and swelling in multiple areas especially with bilateral knee swelling pain and decreased motion. Lived in [place] at the time had a evaluation at a local office then [facility].  She saw orthopedic surgery clinic with symptoms attributed to osteoarthritis.  Subsequently evaluated with rheumatology due to inflammatory changes and having recurrent tendinitis or enthesitis episodes.  X-ray and MRI imaging studies were apparently mostly unremarkable besides some degenerative changes.  Subsequently started breaking out in skin rashes throughout her torso with numerous isolated erythematous papules the subsequently involuted or flattened leaving behind small erythematous patches that resolved over a course of multiple weeks.  She had biopsy of the skin rash reports this was apparently nonspecific with some type of pathology or immunohistochemical staining suggestive for an autoimmune process but not definitive.  She also saw some scalp rash and generalized hair thinning evaluation for this also including biopsy reports this demonstrated telogen effluvium but was otherwise nonspecific.  Also found to have Hashimoto's thyroiditis and started on low-dose levothyroxine supplementation.  She did not start any other disease specific long-term medications though intermittent use of steroids improve her symptoms dramatically.  She resumed cigarette smoking this resolved her symptoms pretty  complet ely for about 9 months until they started to occur at a milder severity than at the onset. She moved to [place] about 2-1/2 years ago noticed new symptom with more severe erythematous facial rash including swelling over the nose and over upper eyelids even affecting her vision.  Lab testing has been mostly unremarkable with a repeat checked late last year showed a positive ANA with negative reflex antibody panel.  Symptoms continue to improve while on steroids but flareup again off of treatment.  Also found partial benefit with skin rashes adding oral antihistamine to low-dose prednisone.  Due to review found of symptoms every time she stopped she is now on prednisone 10 mg daily for the past 4 months Symptoms are fairly well-controlled today.  Most significant joint problem right now is that affecting her hips with tendinitis pain or greater trochanteric pain.  This has been an ongoing problem with previous involvement at epicondylitis on both elbows and Achilles tendinitis in both ankles.     Labs reviewed 01/2022 ANA 1:80 RF neg ESR wnl CRP wnl   10/2020 ANA neg   Review of Systems  Constitutional:  Positive for fatigue.  HENT:  Negative for mouth sores and mouth dryness.   Eyes:  Positive for dryness.  Respiratory:  Negative for shortness of breath.   Cardiovascular:  Positive for palpitations. Negative for  chest pain.  Gastrointestinal:  Negative for blood in stool, constipation and diarrhea.  Endocrine: Negative for increased urination.  Genitourinary:  Negative for involuntary urination.  Musculoskeletal:  Positive for joint pain, joint pain, joint swelling, muscle weakness, morning stiffness and muscle tenderness. Negative for gait problem, myalgias and myalgias.  Skin:  Positive for color change and hair loss. Negative for rash and sensitivity to sunlight.  Allergic/Immunologic: Negative for susceptible to infections.  Neurological:  Negative for dizziness and  headaches.  Hematological:  Negative for swollen glands.  Psychiatric/Behavioral:  Positive for depressed mood and sleep disturbance. The patient is nervous/anxious.     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   Tobacco use disorder, continuous 11/25/2020   Hypothyroidism due to Hashimoto's thyroiditis 10/07/2020   Anxiety and depression 10/07/2020   Eyelid dermatitis, allergic/contact 10/07/2020    Past Medical History:  Diagnosis Date   Alcohol addiction (HCC)    Anxiety    COVID-19 01/11/2021   Depression    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HCC)    Hashimoto's disease    Hyperlipidemia    Long term current use of systemic steroids 08/14/2022   Orofacial granulomatosis    Thyroid disease     Family History  Problem Relation Age of Onset   Lung cancer Mother    Alcoholism Mother    Hypertension Father    Heart disease Father    Hyperlipidemia Brother    Breast cancer Maternal Aunt    Past Surgical History:  Procedure Laterality Date   ABDOMINAL HYSTERECTOMY     BILATERAL CARPAL TUNNEL RELEASE Bilateral    SPINE SURGERY     Social History   Social History Narrative   Not on file   Immunization History  Administered Date(s) Administered   Influenza-Unspecified 03/30/2022   PFIZER(Purple Top)SARS-COV-2 Vaccination 12/17/2019, 01/07/2020     Objective: Vital Signs: BP 104/69 (BP Location: Left Arm, Patient Position: Sitting, Cuff Size: Normal)   Pulse 69   Resp 14   Ht 5\' 3"  (1.6 m)   Wt  121 lb (54.9 kg)   BMI 21.43 kg/m    Physical Exam Eyes:     Conjunctiva/sclera: Conjunctivae normal.  Cardiovascular:     Rate and Rhythm: Normal rate and regular rhythm.  Pulmonary:     Effort: Pulmonary effort is normal.     Breath sounds: Normal breath sounds.  Musculoskeletal:     Right lower leg: No edema.     Left lower leg: No edema.  Lymphadenopathy:     Cervical: No cervical adenopathy.  Skin:    General: Skin is warm and dry.     Findings: No rash.  Neurological:     Mental Status: She is alert.  Psychiatric:        Mood and Affect: Mood normal.      Musculoskeletal Exam:  Elbows full ROM no tenderness or swelling Wrists full ROM no tenderness or swelling Right hand palmar nontender, fixed nodule on palm proximal to 3rd MCP joint Fingers full ROM no tenderness or  swelling Knees full ROM, tenderness, no palpable effusion Ankles full ROM no tenderness or swelling   Investigation: No additional findings.  Imaging: No results found.  Recent Labs: Lab Results  Component Value Date   WBC 6.1 01/22/2023   HGB 12.8 01/22/2023   PLT 364 01/22/2023   NA 140 01/22/2023   K 4.4 01/22/2023   CL 105 01/22/2023   CO2 29 01/22/2023   GLUCOSE 95 01/22/2023   BUN 11 01/22/2023   CREATININE 0.68 01/22/2023   BILITOT 0.5 01/22/2023   ALKPHOS 63 02/09/2022   AST 12 01/22/2023   ALT 9 01/22/2023   PROT 6.9 01/22/2023   ALBUMIN 4.8 02/09/2022   CALCIUM 9.6 01/22/2023   QFTBGOLDPLUS NEGATIVE 08/14/2022    Speciality Comments: No specialty comments available.  Procedures:  No procedures performed Allergies: Venlafaxine   Assessment / Plan:     Visit Diagnoses: Psoriatic arthritis (HCC) - Plan: Sedimentation rate, sulfaSALAzine (AZULFIDINE) 500 MG EC tablet  Persistent joint pain despite Methotrexate use. Methotrexate potentially causing nausea and vomiting. -Discontinue Methotrexate and observe for 2-3 weeks to assess for flare-ups. -Plan to switch to  enteric coated Sulfasalazine after the observation period. -Consider short term steroids as a backup plan in case of severe flare-up during the transition period.   High risk medication use - could try gradual titration methotrexate up to 12.5 mg subcu weekly and continue folic acid 1 mg daily. 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 No current functional impairment. -Observe and monitor for changes or functional impairment.  Psoriasis Well controlled with current treatment. -Plan to switch treatment, monitor any exacerbation   Orders: Orders Placed This Encounter  Procedures   Sedimentation rate   CBC with Differential/Platelet   COMPLETE METABOLIC PANEL WITH GFR   Meds ordered this encounter  Medications   sulfaSALAzine (AZULFIDINE) 500 MG EC tablet    Sig: Take 1 tablet (500 mg total) by mouth 2 (two) times daily.    Dispense:  60 tablet    Refill:  2     Follow-Up Instructions: Return in about 3 months (around 07/02/2023) for PsA SSZ switch f/u 3mos.   Fuller Plan, MD  Note - This record has been created using [employer].  Chart creation errors have been sought, but may not always  have been located. Such creation errors do not reflect on  the standard of medical care.

## 2023-04-03 ENCOUNTER — Encounter: Payer: Self-pay | Admitting: Internal Medicine

## 2023-04-03 ENCOUNTER — Ambulatory Visit: Payer: PRIVATE HEALTH INSURANCE | Attending: Internal Medicine | Admitting: Internal Medicine

## 2023-04-03 VITALS — BP 104/69 | HR 69 | Resp 14 | Ht 63.0 in | Wt 121.0 lb

## 2023-04-03 DIAGNOSIS — Z79899 Other long term (current) drug therapy: Secondary | ICD-10-CM | POA: Diagnosis not present

## 2023-04-03 DIAGNOSIS — L4050 Arthropathic psoriasis, unspecified: Secondary | ICD-10-CM

## 2023-04-03 DIAGNOSIS — R21 Rash and other nonspecific skin eruption: Secondary | ICD-10-CM | POA: Diagnosis not present

## 2023-04-03 DIAGNOSIS — M720 Palmar fascial fibromatosis [Dupuytren]: Secondary | ICD-10-CM | POA: Insufficient documentation

## 2023-04-03 DIAGNOSIS — L404 Guttate psoriasis: Secondary | ICD-10-CM

## 2023-04-03 DIAGNOSIS — M130 Polyarthritis, unspecified: Secondary | ICD-10-CM

## 2023-04-03 MED ORDER — SULFASALAZINE 500 MG PO TBEC: 500.0000 mg | DELAYED_RELEASE_TABLET | Freq: Two times a day (BID) | ORAL | 2 refills | Status: DC

## 2023-04-04 LAB — COMPLETE METABOLIC PANEL WITH GFR
AG Ratio: 2.1 (calc) (ref 1.0–2.5)
ALT: 9 U/L (ref 6–29)
AST: 13 U/L (ref 10–35)
Albumin: 4.5 g/dL (ref 3.6–5.1)
Alkaline phosphatase (APISO): 75 U/L (ref 31–125)
BUN: 15 mg/dL (ref 7–25)
CO2: 29 mmol/L (ref 20–32)
Calcium: 9.2 mg/dL (ref 8.6–10.2)
Chloride: 103 mmol/L (ref 98–110)
Creat: 0.68 mg/dL (ref 0.50–0.99)
Globulin: 2.1 g/dL (ref 1.9–3.7)
Glucose, Bld: 89 mg/dL (ref 65–99)
Potassium: 4.2 mmol/L (ref 3.5–5.3)
Sodium: 137 mmol/L (ref 135–146)
Total Bilirubin: 0.4 mg/dL (ref 0.2–1.2)
Total Protein: 6.6 g/dL (ref 6.1–8.1)
eGFR: 107 mL/min/{1.73_m2} (ref >=60)

## 2023-04-04 LAB — CBC WITH DIFFERENTIAL/PLATELET
Absolute Lymphocytes: 2654 {cells}/uL (ref 850–3900)
Absolute Monocytes: 600 {cells}/uL (ref 200–950)
Basophils Absolute: 44 {cells}/uL (ref 0–200)
Basophils Relative: 0.5 %
Eosinophils Absolute: 183 {cells}/uL (ref 15–500)
Eosinophils Relative: 2.1 %
HCT: 37.5 % (ref 35.0–45.0)
Hemoglobin: 12.7 g/dL (ref 11.7–15.5)
MCH: 31.8 pg (ref 27.0–33.0)
MCHC: 33.9 g/dL (ref 32.0–36.0)
MCV: 93.8 fL (ref 80.0–100.0)
MPV: 9.7 fL (ref 7.5–12.5)
Monocytes Relative: 6.9 %
Neutro Abs: 5220 {cells}/uL (ref 1500–7800)
Neutrophils Relative %: 60 %
Platelets: 327 10*3/uL (ref 140–400)
RBC: 4 10*6/uL (ref 3.80–5.10)
RDW: 13.2 % (ref 11.0–15.0)
Total Lymphocyte: 30.5 %
WBC: 8.7 10*3/uL (ref 3.8–10.8)

## 2023-04-04 LAB — SEDIMENTATION RATE: Sed Rate: 6 mm/h (ref 0–20)

## 2023-05-29 ENCOUNTER — Encounter: Payer: Self-pay | Admitting: Nurse Practitioner

## 2023-05-29 ENCOUNTER — Telehealth: Payer: Self-pay | Admitting: Nurse Practitioner

## 2023-05-29 MED ORDER — ZOLPIDEM TARTRATE 5 MG PO TABS: 5.0000 mg | ORAL_TABLET | Freq: Every evening | ORAL | 1 refills | Status: AC | PRN

## 2023-05-29 NOTE — Telephone Encounter (Signed)
Pt called requesting Ambien, she has appointment on 2/4 ,She has not been prescribed meds for sleep.  Advised pt that we can send a message back but generally if they have not been seen for the issue before patient would need appointment. Let her know that we can watch for earlier appt if someone cancels, she states she is on cancellation list . I let her know that we sometimes have openings that don't show up on patient schedule. Patient wanted to speak with nurse advised her that they were with patients and that I can send message to [place] Dec, patient advised that she would send message to Hunt[place] Dec thru portal

## 2023-06-05 ENCOUNTER — Telehealth: Payer: PRIVATE HEALTH INSURANCE | Admitting: Nurse Practitioner

## 2023-06-05 ENCOUNTER — Encounter: Payer: Self-pay | Admitting: Nurse Practitioner

## 2023-06-05 DIAGNOSIS — G4709 Other insomnia: Secondary | ICD-10-CM | POA: Diagnosis not present

## 2023-06-05 DIAGNOSIS — N952 Postmenopausal atrophic vaginitis: Secondary | ICD-10-CM

## 2023-06-05 DIAGNOSIS — F413 Other mixed anxiety disorders: Secondary | ICD-10-CM

## 2023-06-05 DIAGNOSIS — N951 Menopausal and female climacteric states: Secondary | ICD-10-CM | POA: Diagnosis not present

## 2023-06-05 MED ORDER — ESTRADIOL 0.1 MG/GM VA CREA: TOPICAL_CREAM | VAGINAL | 6 refills | Status: AC

## 2023-06-05 MED ORDER — PROPRANOLOL HCL 10 MG PO TABS: 10.0000 mg | ORAL_TABLET | Freq: Three times a day (TID) | ORAL | 1 refills | Status: AC | PRN

## 2023-06-05 MED ORDER — VEOZAH 45 MG PO TABS: 45.0000 mg | ORAL_TABLET | Freq: Every day | ORAL | 6 refills | Status: AC

## 2023-06-05 NOTE — Patient Instructions (Addendum)
 I have put in the orders for the labs. You can come by to have these done at your convenience.   I have also sent in estradiol  cream to be inserted vaginally. Start with every other day for 1 week then 1-2 times a week for maintenance. You can also use aquaphor emollient to help with vaginal dryness daily. This is not harmful to be placed vaginally or in the surrounding perineal area and can be very helpful to keep moisture in and act as a lubricant during intercourse.   I sent the Veozah  for you to try for the hot flashes. There is a coupon online that should bring the price down significantly.   We will see how your sleep and mood behave with the above changes and then plan to regroup in a month.  We can consider Vraylar, if needed, to help with the hypomania symptoms that are present.

## 2023-06-05 NOTE — Assessment & Plan Note (Signed)
 Menopause with severe symptoms including hot flashes, insomnia, and heart palpitations. Symptoms have persisted for about a year, with hot flashes occurring every 10 minutes from 6 PM to 6 AM. Insomnia has been present since Thanksgiving, and heart palpitations were noted for 3-4 weeks but have ceased for the past 4 days. She is hesitant about hormone therapy but is open to non-hormonal treatments. She has tried and failed SSRI's, SNRI's, and beta blockers for treatment. Her blood pressures historically run low, limiting the ability to trial of clonidine. Discussed Veozah  for hot flashes, which is effective for hot flashes but does not address other symptoms. She prefers to address hot flashes first and reassess in one month. - Prescribe Veozah  for hot flashes - Order hormone blood work to assess estrogen, progesterone , and testosterone  levels - Follow up in one month to reassess symptoms and consider Vraylar if hypomanic symptoms persist

## 2023-06-05 NOTE — Progress Notes (Signed)
 Virtual Visit Encounter mychart visit.   I connected with  Chelsea Hill on 06/05/23 at 11:15 AM EST by secure video and audio telemedicine application. I verified that I am speaking with the correct person using two identifiers.   I introduced myself as a Publishing Rights Manager with the practice. The limitations of evaluation and management by telemedicine discussed with the patient and the availability of in person appointments. The patient expressed verbal understanding and consent to proceed.  Participating parties in this visit include: Myself and patient  The patient is: Patient Location: Home I am: Provider Location: Office/Clinic Subjective:    CC and HPI: Chelsea Hill is a 48 y.o. year old female presenting for new evaluation and treatment of peri-menopause symptoms.  History of Present Illness Chelsea Hill is a 48 year old female who presents with severe menopausal symptoms including hot flashes, insomnia, and heart palpitations.  She has been experiencing severe menopausal symptoms for about a year, including hot flashes, at times every 10 minutes from 6 PM to 6 AM. 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 She has been on SSRI and SNRI medications with no change in her symptoms. She would like to avoid systemic hormones, if possible.   Insomnia began around Thanksgiving, and despite taking 5 mg of Ambien , she remains unable to sleep. She describes a sensation of being in a 'manic type of state' where her eyelids 'just want to pop open,' attributing part of her insomnia to feeling stuck in a 'fight or flight' mode. She is unable to settle her mind despite exhaustion.   Heart palpitations have been present for about three to four weeks but have subsided in the past four days. She has been using propranolol  to manage these symptoms, which she believes are  related to her heightened state of alertness.  She underwent a hysterectomy but retains both ovaries. She notes a change in her hair texture post-surgery and has felt off and on symptoms since then. Her mother did not experience menopausal symptoms, which she finds surprising given her own severe symptoms.  She has a history of seasonal depression and was previously prescribed venlafaxine, which she believes induced symptoms similar to her current state. She describes experiencing manic episodes lasting about three days, but this time feels 'stuck' in this state. She is currently on Prozac , which she tolerates well. She has not tried any medications specific for bipolar disorder, but is open to this option.   She reports dyspareunia, which she attributes to menopausal changes, and is interested in treatment options for this symptom.  She works in a hospital setting and has been exposed to influenza A, which she contracted recently. She has been working more night shifts than usual and plans to reduce her work schedule in March to improve her sleep and overall health.  Past medical history, Surgical history, Family history not pertinant except as noted below, Social history, Allergies, and medications have been entered into the medical record, reviewed, and corrections made.   Review of Systems:  All review of systems negative except what is listed in the HPI  Objective:    Alert and oriented x 4 Speaking in clear sentences with no shortness of breath. No distress.  Impression and Recommendations:    Problem List Items Addressed This Visit     Peri-menopause - Primary   Menopause with severe symptoms including hot flashes,  insomnia, and heart palpitations. Symptoms have persisted for about a year, with hot flashes occurring every 10 minutes from 6 PM to 6 AM. Insomnia has been present since Thanksgiving, and heart palpitations were noted for 3-4 weeks but have ceased for the past 4 days. She  is hesitant about hormone therapy but is open to non-hormonal treatments. She has tried and failed SSRI's, SNRI's, and beta blockers for treatment. Her blood pressures historically run low, limiting the ability to trial of clonidine. Discussed Veozah  for hot flashes, which is effective for hot flashes but does not address other symptoms. She prefers to address hot flashes first and reassess in one month. - Prescribe Veozah  for hot flashes - Order hormone blood work to assess estrogen, progesterone , and testosterone  levels - Follow up in one month to reassess symptoms and consider Vraylar if hypomanic symptoms persist      Relevant Medications   Fezolinetant  (VEOZAH ) 45 MG TABS   estradiol  (ESTRACE ) 0.1 MG/GM vaginal cream   Other Relevant Orders   Comprehensive metabolic panel   TSH   Estradiol    FSH/LH   Testosterone , Free, Total, SHBG   T4, free   Progesterone    Other insomnia   Suspected bipolar disorder with hypomanic episodes and long term insomnia not controlled with ambien . She reports seasonal depression and symptoms suggestive of hypomania, including heightened alertness, amped up, mental unease, and difficulty sleeping. Previous adverse reaction to venlafaxine, which exacerbated manic symptoms. Discussed Vraylar, which self-adjusts to balance manic and depressive states. There is concern that this medication cannot be split. Historically she has required lower than usual doses of medications. We can consider every other day dosing based on the long  half life of the medication to help offset the risk of adverse reaction. She prefers to try non-hormonal treatments for hot flashes first. - Consider Vraylar if hypomanic symptoms persist after addressing hot flashes and insomnia - Monitor for worsening symptoms and adjust treatment as needed      Relevant Orders   Comprehensive metabolic panel   TSH   Estradiol    FSH/LH   Testosterone , Free, Total, SHBG   T4, free   Progesterone     Hot flashes due to menopause   Relevant Medications   Fezolinetant  (VEOZAH ) 45 MG TABS   propranolol  (INDERAL ) 10 MG tablet   Other Relevant Orders   Comprehensive metabolic panel   TSH   Estradiol    FSH/LH   Testosterone , Free, Total, SHBG   T4, free   Progesterone    Vaginal atrophy   Vaginal atrophy causing dyspareunia. She reports significant pain during intercourse. Discussed the use of estradiol  cream, which does not absorb systemically and helps increase moisture and elasticity. - Prescribe estradiol  cream for vaginal atrophy, to be used every day or every other day for a week, then once to twice a week for maintenance      Relevant Medications   estradiol  (ESTRACE ) 0.1 MG/GM vaginal cream   propranolol  (INDERAL ) 10 MG tablet   Other Relevant Orders   Comprehensive metabolic panel   TSH   Estradiol    FSH/LH   Testosterone , Free, Total, SHBG   T4, free   Progesterone    Other Visit Diagnoses       Other mixed anxiety disorders       Relevant Medications   propranolol  (INDERAL ) 10 MG tablet   Other Relevant Orders   Comprehensive metabolic panel   TSH   Estradiol    FSH/LH   Testosterone , Free, Total, SHBG   T4,  free   Progesterone        orders and follow up as documented in EMR I discussed the assessment and treatment plan with the patient. The patient was provided an opportunity to ask questions and all were answered. The patient agreed with the plan and demonstrated an understanding of the instructions.   The patient was advised to call back or seek an in-person evaluation if the symptoms worsen or if the condition fails to improve as anticipated.  Follow-Up: in a month  I provided 39 minutes of non-face-to-face interaction with this non face-to-face encounter including intake, same-day documentation, and chart review.   Camie CHARLENA Doing, NP , DNP, AGNP-c [facility]

## 2023-06-05 NOTE — Assessment & Plan Note (Signed)
 Suspected bipolar disorder with hypomanic episodes and long term insomnia not controlled with ambien . She reports seasonal depression and symptoms suggestive of hypomania, including heightened alertness, amped up, mental unease, and difficulty sleeping. Previous adverse reaction to venlafaxine, which exacerbated manic symptoms. Discussed Vraylar, which self-adjusts to balance manic and depressive states. There is concern that this medication cannot be split. Historically she has required lower than usual doses of medications. We can consider every other day dosing based on the long  half life of the medication to help offset the risk of adverse reaction. She prefers to try non-hormonal treatments for hot flashes first. - Consider Vraylar if hypomanic symptoms persist after addressing hot flashes and insomnia - Monitor for worsening symptoms and adjust treatment as needed

## 2023-06-05 NOTE — Assessment & Plan Note (Signed)
 Vaginal atrophy causing dyspareunia. She reports significant pain during intercourse. Discussed the use of estradiol  cream, which does not absorb systemically and helps increase moisture and elasticity. - Prescribe estradiol  cream for vaginal atrophy, to be used every day or every other day for a week, then once to twice a week for maintenance

## 2023-06-06 ENCOUNTER — Telehealth: Payer: Self-pay

## 2023-06-06 ENCOUNTER — Other Ambulatory Visit (HOSPITAL_COMMUNITY): Payer: Self-pay

## 2023-06-06 NOTE — Telephone Encounter (Signed)
 Pharmacy Patient Advocate Encounter   Received notification from CoverMyMeds that prior authorization for [place]  is required/requested.   Insurance verification completed.   The patient is insured through  [facility] Physician Certification Prior  .   Per test claim: PA required; PA submitted to above mentioned insurance via Fax Key/confirmation #/EOC fax via cmm ((Key: [id number]))      Status is pending

## 2023-06-18 NOTE — Progress Notes (Deleted)
 Office Visit Note  Patient: Chelsea Hill             Date of Birth: May 22, 1975           MRN: [id number]             PCP: Tollie Eth, NP Referring: Tollie Eth, NP Visit Date: 07/02/2023   Subjective:  No chief complaint on file.   History of Present Illness: Chelsea Hill is a 48 y.o. 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   Previous HPI 04/03/2023 Chelsea Hill is a 48 y.o. 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  Presents with a new lump in the palm of their hand that developed subtly over the past couple of months.  She for started noticing it since about 5 days after her last clinic appointment.  The lump, initially painless, has started to cause discomfort when pressure is applied or when the patient attempts to pick up objects. The patient also reports a sensation of the lump spreading towards the adjacent fingers.   In addition to the lump, the patient reports dissatisfaction with their current methotrexate treatment. They express concerns about the medication's efficacy, noting that it seems to trigger flares some weeks. The patient also experienced a severe episode of nausea and vomiting, which they suspect might be related to the methotrexate. They report worsening arthritis symptoms, particularly in the knees, feet, and hands. She needs need to take ibuprofen 600 mg as needed for breakthrough joint pain estimates this 6 times per week.   Previous HPI 01/22/2023 Chelsea Hill is a 48 y.o. 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 She started methotrexate 10 mg [place] weekly since last visit feels her inflammation and distal finger nodules worsened initially before stabilizing but in the last week feels symptoms are partially improved. Noticed 4  days of improvement after methotrexate injection then worsening in joints and skin itching. She is not seeing new rashes break out but gets intense itching that she associates preceding her rashes usually, and a few spots appear after scratching.   Previous HPI 12/11/2022 Chelsea Hill is a 48 y.o. 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  Currently tolerating taking 2 tablets at night only.  She reports a definite change in symptoms while on sulfasalazine partial improvement in joint pain and stiffness and her rashes are well-controlled.  When stopping the medicine entirely for 4 days or longer started to notice rash coming back in multiple areas including very itchy inflammation on the scalp.  She had to mild viral upper respiratory infections since our last visit no complications.   Previous HPI 10/09/2022 Chelsea Hill is a 48 y.o. 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 She has seen good improvement in skin rash and pleuritic chest pain symptoms are currently resolved. Joint pains including hips which were worst are about 75% better right now. She notices nausea every day while taking 1000 mg BID dose, but was tolerating 1 tablet BID without issue. Sometimes skips a dose  when having worse upset. No other side effect noted. Still fatigued, maybe 25% better not as much as her joint issues.   Previous HPI 08/14/22 Chelsea Hill is a 48 y.o. female here for follow up for her joint pain and rashes in multiple areas currently feeling very poorly after tapering down the prednisone since our last visit.  Lab testing at that visit redemonstrated a positive ANA but was otherwise completely  unremarkable including acute inflammatory markers and immunoglobulins.  Histamine release assay was also unremarkable.   Previous HPI 07/04/22 Chelsea Hill is a 48 y.o. female here for evaluation of positive ANA and multiple chronic symptoms including joint pain, fatigue, and rashes, and other problems with episodic flare ups.  She noticed problems new or severely increased when she quit smoking about 5 years ago.  She developed facial rashes and joint pain and swelling in multiple areas especially with bilateral knee swelling pain and decreased motion. Lived in [place] at the time had a evaluation at a local office then [facility].  She saw orthopedic surgery clinic with symptoms attributed to osteoarthritis.  Subsequently evaluated with rheumatology due to inflammatory changes and having recurrent tendinitis or enthesitis episodes.  X-ray and MRI imaging studies were apparently mostly unremarkable besides some degenerative changes.  Subsequently started breaking out in skin rashes throughout her torso with numerous isolated erythematous papules the subsequently involuted or flattened leaving behind small erythematous patches that resolved over a course of multiple weeks.  She had biopsy of the skin rash reports this was apparently nonspecific with some type of pathology or immunohistochemical staining suggestive for an autoimmune process but not definitive.  She also saw some scalp rash and generalized hair thinning evaluation for this also including biopsy reports this demonstrated telogen effluvium but was otherwise nonspecific.  Also found to have Hashimoto's thyroiditis and started on low-dose levothyroxine supplementation.  She did not start any other disease specific long-term medications though intermittent use of steroids improve her symptoms dramatically.  She resumed cigarette smoking this resolved her symptoms pretty complet ely for about 9 months until they started to occur at a  milder severity than at the onset. She moved to [place] about 2-1/2 years ago noticed new symptom with more severe erythematous facial rash including swelling over the nose and over upper eyelids even affecting her vision.  Lab testing has been mostly unremarkable with a repeat checked late last year showed a positive ANA with negative reflex antibody panel.  Symptoms continue to improve while on steroids but flareup again off of treatment.  Also found partial benefit with skin rashes adding oral antihistamine to low-dose prednisone.  Due to review found of symptoms every time she stopped she is now on prednisone 10 mg daily for the past 4 months Symptoms are fairly well-controlled today.  Most significant joint problem right now is that affecting her hips with tendinitis pain or greater trochanteric pain.  This has been an ongoing problem with previous involvement at epicondylitis on both elbows and Achilles tendinitis in both ankles.     Labs reviewed 01/2022 ANA 1:80 RF neg ESR wnl CRP wnl   10/2020 ANA neg   No Rheumatology ROS completed.   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   Psoriatic  arthritis (HCC) 08/14/2022   Positive ANA (antinuclear antibody) 02/27/2022   Family history of premature CAD 02/27/2022   Acute bilateral low back pain with bilateral sciatica 06/07/2021   Greater trochanteric pain syndrome of left lower extremity 05/25/2021   Coccyx pain 05/25/2021   Joint pain 11/25/2020   Rash 11/25/2020   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   Tobacco use disorder, continuous 11/25/2020   Hypothyroidism due to Hashimoto's thyroiditis 10/07/2020   Anxiety and depression 10/07/2020   Eyelid dermatitis, allergic/contact 10/07/2020    Past  Medical History:  Diagnosis Date   Alcohol addiction (HCC)    Anxiety    COVID-19 01/11/2021   Depression    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HCC)    Hashimoto's disease    Hyperlipidemia    Long term current use of systemic steroids 08/14/2022   Orofacial granulomatosis    Thyroid disease     Family History  Problem Relation Age of Onset   Lung cancer Mother    Alcoholism Mother    Hypertension Father    Heart disease Father    Hyperlipidemia Brother    Breast cancer Maternal Aunt    Past Surgical History:  Procedure Laterality Date   ABDOMINAL HYSTERECTOMY     BILATERAL CARPAL TUNNEL RELEASE Bilateral    SPINE SURGERY     Social History   Social History Narrative   Not on file   Immunization History  Administered Date(s) Administered   Influenza-Unspecified 03/30/2022   PFIZER(Purple Top)SARS-COV-2 Vaccination 12/17/2019, 01/07/2020     Objective: Vital Signs: There were no vitals taken for this visit.   Physical Exam   Musculoskeletal Exam: ***  CDAI Exam: CDAI Score: -- Patient Global: --; Provider Global: -- Swollen: --; Tender: -- Joint Exam 07/02/2023   No joint exam has been documented for this visit   There is currently no information documented on the homunculus. Go to the Rheumatology activity and complete the homunculus joint exam.  Investigation: No additional findings.  Imaging: No results found.  Recent Labs: Lab Results  Component Value Date   WBC 8.7 04/03/2023   HGB 12.7 04/03/2023   PLT 327 04/03/2023   NA 137 04/03/2023   K 4.2 04/03/2023   CL 103 04/03/2023   CO2 29 04/03/2023   GLUCOSE 89 04/03/2023   BUN 15 04/03/2023   CREATININE 0.68 04/03/2023   BILITOT 0.4 04/03/2023   ALKPHOS 63 02/09/2022   AST 13 04/03/2023   ALT 9 04/03/2023   PROT 6.6 04/03/2023   ALBUMIN 4.8 02/09/2022   CALCIUM 9.2 04/03/2023   QFTBGOLDPLUS NEGATIVE 08/14/2022    Speciality Comments: No specialty comments available.  Procedures:  No  procedures performed Allergies: Venlafaxine   Assessment / Plan:     Visit Diagnoses: No diagnosis found.  ***  Orders: No orders of the defined types were placed in this encounter.  No orders of the defined types were placed in this encounter.    Follow-Up Instructions: No follow-ups on file.   Metta Clines, RT  Note - This record has been created using [employer].  Chart creation errors have been sought, but may not always  have been located. Such creation errors do not reflect on  the standard of medical care.

## 2023-06-20 ENCOUNTER — Other Ambulatory Visit (HOSPITAL_COMMUNITY): Payer: Self-pay

## 2023-06-26 NOTE — Progress Notes (Signed)
 Shawna Clamp, DNP, AGNP-c [facility] [address] [place], [place] [address] Main Office [phone]  BP 120/72   Pulse 80   Ht 5' 3.5" (1.613 m)   Wt 117 lb 6.4 oz (53.3 kg)   BMI 20.47 kg/m    Subjective:    Patient ID: Chelsea Hill, female    DOB: [birth year], 48 y.o.   MRN: [id number]  HPI: Chelsea Hill is a 47 y.o. female presenting on 06/28/2023 for comprehensive medical examination.   History of Present Illness Chelsea Hill is a 48 year old female with psoriatic arthritis and autoimmune dysfunction who presents with medication-related side effects and disease flare-ups.  She is experiencing a flare-up of psoriatic arthritis symptoms, which she believes was exacerbated by a recent influenza A infection. She was previously on methotrexate but discontinued it due to significant gastrointestinal side effects, leading to missed work. After stopping methotrexate, she initially felt better but then experienced a return of symptoms. She is currently managing her pain with ibuprofen and has not yet started a new medication, sulfasalazine, which is still at the pharmacy. She is concerned about potential bone damage due to inflammation and is interested in imaging to assess this. She describes a return of a rash, which has mostly subsided, but continues to experience significant inflammation and pain, particularly in her back, where she has developed new lumps over the past six months.  She mentions experiencing menopause symptoms, which have recently improved since starting Veozah, although she initially suspected it was causing gastrointestinal issues.  She reports a history of familial hypercholesterolemia, with her grandfather and his brother having died from heart attacks. She has been approached twice to participate in a study for this condition but has not followed through. She does not take any statins or baby aspirin.  In terms of social history,  she smokes about a pack of cigarettes a day. She works in a hospital setting and describes the current environment as overwhelming. She has experienced significant stress at home, which she believes has contributed to her recent health issues.  No fullness in the throat or difficulty swallowing. She reports changes in bowel habits, including constipation and diarrhea, which she attributes to influenza A.   Pertinent items are noted in HPI.  IMMUNIZATIONS:   Tetanus: Tetanus declined, patient will complete at a later date COVID: COVID contraindicated  HEALTH MAINTENANCE: Colon Cancer Screening HM Status: was ordered today Consider Bone density testing for prolonged steroid use  Concerns with vision, hearing, or dentition: No  Most Recent Depression Screen:     06/28/2023    9:42 AM 12/21/2022    2:46 PM 09/20/2021    4:02 PM 10/07/2020    8:55 AM  Depression screen PHQ 2/9  Decreased Interest 3 3 0 1  Down, Depressed, Hopeless 3 2 0 1  PHQ - 2 Score 6 5 0 2  Altered sleeping 1 3  0  Tired, decreased energy 3 1  3   Change in appetite 0 0  1  Feeling bad or failure about yourself  3 1  1   Trouble concentrating 1 3  1   Moving slowly or fidgety/restless 0 0  0  Suicidal thoughts 0 0  0  PHQ-9 Score 14 13  8   Difficult doing work/chores Very difficult Very difficult  Very difficult   Most Recent Anxiety Screen:     10/07/2020    8:56 AM  GAD 7 : Generalized Anxiety Score  Nervous, Anxious, on Edge  2  Control/stop worrying 3  Worry too much - different things 3  Trouble relaxing 3  Restless 2  Easily annoyed or irritable 3  Afraid - awful might happen 2  Total GAD 7 Score 18  Anxiety Difficulty Very difficult   Most Recent Fall Screen:    06/28/2023    9:42 AM 12/21/2022    2:46 PM 09/20/2021    4:02 PM 10/07/2020    8:55 AM  Fall Risk   Falls in the past year? 0 0 0 0  Number falls in past yr: 0 0 0 0  Injury with Fall? 0 0 0 0  Risk for fall due to : No Fall Risks No  Fall Risks No Fall Risks No Fall Risks  Follow up Falls evaluation completed Falls evaluation completed Falls evaluation completed Falls evaluation completed    Past medical history, surgical history, medications, allergies, family history and social history reviewed with patient today and changes made to appropriate areas of the chart.  Past Medical History:  Past Medical History:  Diagnosis Date   Alcohol addiction (HCC)    Anxiety    COVID-19 01/11/2021   Depression    Drug addiction in remission [facility])    Hashimoto's disease    Hyperlipidemia    Long term current use of systemic steroids 08/14/2022   Orofacial granulomatosis    Thyroid disease    Medications:  Current Outpatient Medications on File Prior to Visit  Medication Sig   Calcium Carb-Cholecalciferol (CALCIUM 600/VITAMIN D PO) Take by mouth daily.   Fezolinetant (VEOZAH) 45 MG TABS Take 1 tablet (45 mg total) by mouth at bedtime.   FLUoxetine (PROZAC) 20 MG tablet Take 1 tablet (20 mg total) by mouth daily.   ibuprofen (ADVIL) 800 MG tablet Take 1 tablet (800 mg total) by mouth every 8 (eight) hours as needed.   levothyroxine (SYNTHROID) 25 MCG tablet Take 1 tablet (25 mcg total) by mouth daily before breakfast.   ondansetron (ZOFRAN-ODT) 8 MG disintegrating tablet Take 1 tablet (8 mg total) by mouth every 8 (eight) hours as needed for nausea.   propranolol (INDERAL) 10 MG tablet Take 1 tablet (10 mg total) by mouth 3 (three) times daily as needed (palpitations).   Vitamin D, Ergocalciferol, (DRISDOL) 1.25 MG (50000 UNIT) CAPS capsule Take 1 capsule (50,000 Units total) by mouth every 7 (seven) days.   zolpidem (AMBIEN) 5 MG tablet Take 1 tablet (5 mg total) by mouth at bedtime as needed for sleep.   estradiol (ESTRACE) 0.1 MG/GM vaginal cream 1 gram vaginally every other day for the first week then once to twice weekly. (Patient not taking: Reported on 06/28/2023)   sulfaSALAzine (AZULFIDINE) 500 MG EC tablet Take 1 tablet  (500 mg total) by mouth 2 (two) times daily. (Patient not taking: Reported on 06/28/2023)   No current facility-administered medications on file prior to visit.   Surgical History:  Past Surgical History:  Procedure Laterality Date   ABDOMINAL HYSTERECTOMY     BILATERAL CARPAL TUNNEL RELEASE Bilateral    SPINE SURGERY     Allergies:  Allergies  Allergen Reactions   Venlafaxine    Family History:  Family History  Problem Relation Age of Onset   Lung cancer Mother    Alcoholism Mother    Hypertension Father    Heart disease Father    Hyperlipidemia Brother    Breast cancer Maternal Aunt        Objective:    BP 120/72  Pulse 80   Ht 5' 3.5" (1.613 m)   Wt 117 lb 6.4 oz (53.3 kg)   BMI 20.47 kg/m   Wt Readings from Last 3 Encounters:  06/28/23 117 lb 6.4 oz (53.3 kg)  04/03/23 121 lb (54.9 kg)  01/22/23 124 lb (56.2 kg)    Physical Exam Vitals and nursing note reviewed.  Constitutional:      General: She is not in acute distress.    Appearance: Normal appearance.  HENT:     Head: Normocephalic and atraumatic.     Right Ear: Hearing, tympanic membrane, ear canal and external ear normal.     Left Ear: Hearing, tympanic membrane, ear canal and external ear normal.     Nose: Nose normal.     Right Sinus: No maxillary sinus tenderness or frontal sinus tenderness.     Left Sinus: No maxillary sinus tenderness or frontal sinus tenderness.     Mouth/Throat:     Lips: Pink.     Mouth: Mucous membranes are moist.     Pharynx: Oropharynx is clear.  Eyes:     General: Lids are normal. Vision grossly intact.     Extraocular Movements: Extraocular movements intact.     Conjunctiva/sclera: Conjunctivae normal.     Pupils: Pupils are equal, round, and reactive to light.     Funduscopic exam:    Right eye: Red reflex present.        Left eye: Red reflex present.    Visual Fields: Right eye visual fields normal and left eye visual fields normal.  Neck:     Thyroid: No  thyromegaly.     Vascular: No carotid bruit.  Cardiovascular:     Rate and Rhythm: Normal rate and regular rhythm.     Chest Wall: PMI is not displaced.     Pulses: Normal pulses.          Dorsalis pedis pulses are 2+ on the right side and 2+ on the left side.       Posterior tibial pulses are 2+ on the right side and 2+ on the left side.     Heart sounds: Normal heart sounds. No murmur heard. Pulmonary:     Effort: Pulmonary effort is normal. No respiratory distress.     Breath sounds: Normal breath sounds.  Abdominal:     General: Abdomen is flat. Bowel sounds are normal. There is no distension.     Palpations: Abdomen is soft. There is no hepatomegaly, splenomegaly or mass.     Tenderness: There is no abdominal tenderness. There is no right CVA tenderness, left CVA tenderness, guarding or rebound.  Musculoskeletal:        General: Normal range of motion.     Cervical back: Full passive range of motion without pain, normal range of motion and neck supple. No tenderness.     Right lower leg: No edema.     Left lower leg: No edema.  Feet:     Left foot:     Toenail Condition: Left toenails are normal.  Lymphadenopathy:     Cervical: No cervical adenopathy.     Upper Body:     Right upper body: No supraclavicular adenopathy.     Left upper body: No supraclavicular adenopathy.  Skin:    General: Skin is warm and dry.     Capillary Refill: Capillary refill takes less than 2 seconds.     Nails: There is no clubbing.  Neurological:     General: No  focal deficit present.     Mental Status: She is alert and oriented to person, place, and time.     GCS: GCS eye subscore is 4. GCS verbal subscore is 5. GCS motor subscore is 6.     Sensory: Sensation is intact.     Motor: Motor function is intact.     Coordination: Coordination is intact.     Gait: Gait is intact.     Deep Tendon Reflexes: Reflexes are normal and symmetric.  Psychiatric:        Attention and Perception: Attention  normal.        Mood and Affect: Mood normal.        Speech: Speech normal.        Behavior: Behavior normal. Behavior is cooperative.        Thought Content: Thought content normal.        Cognition and Memory: Cognition and memory normal.        Judgment: Judgment normal.      Results for orders placed or performed in visit on 06/28/23  Progesterone   Collection Time: 06/28/23 10:32 AM  Result Value Ref Range   Progesterone <0.1 ng/mL  T4, free   Collection Time: 06/28/23 10:32 AM  Result Value Ref Range   Free T4 1.09 0.82 - 1.77 ng/dL  Testosterone, Free, Total, SHBG   Collection Time: 06/28/23 10:32 AM  Result Value Ref Range   Testosterone <3 (L) 4 - 50 ng/dL   Testosterone, Free <9.6 0.0 - 4.2 pg/mL   Sex Hormone Binding 82.9 24.6 - 122.0 nmol/L  FSH/LH   Collection Time: 06/28/23 10:32 AM  Result Value Ref Range   LH 53.4 mIU/mL   FSH 122.0 mIU/mL  Estradiol   Collection Time: 06/28/23 10:32 AM  Result Value Ref Range   Estradiol <5.0 pg/mL  TSH   Collection Time: 06/28/23 10:32 AM  Result Value Ref Range   TSH 1.640 0.450 - 4.500 uIU/mL  Comprehensive metabolic panel   Collection Time: 06/28/23 10:32 AM  Result Value Ref Range   Glucose 98 70 - 99 mg/dL   BUN 9 6 - 24 mg/dL   Creatinine, Ser 2.95 0.57 - 1.00 mg/dL   eGFR 284 >13 KG/MWN/0.27   BUN/Creatinine Ratio 13 9 - 23   Sodium 139 134 - 144 mmol/L   Potassium 4.5 3.5 - 5.2 mmol/L   Chloride 103 96 - 106 mmol/L   CO2 22 20 - 29 mmol/L   Calcium 9.5 8.7 - 10.2 mg/dL   Total Protein 6.5 6.0 - 8.5 g/dL   Albumin 4.5 3.9 - 4.9 g/dL   Globulin, Total 2.0 1.5 - 4.5 g/dL   Bilirubin Total 0.3 0.0 - 1.2 mg/dL   Alkaline Phosphatase 93 44 - 121 IU/L   AST 17 0 - 40 IU/L   ALT 13 0 - 32 IU/L  CBC with Differential/Platelet   Collection Time: 06/28/23 10:38 AM  Result Value Ref Range   WBC 6.3 3.4 - 10.8 x10E3/uL   RBC 4.10 3.77 - 5.28 x10E6/uL   Hemoglobin 12.7 11.1 - 15.9 g/dL   Hematocrit 25.3 66.4  - 46.6 %   MCV 92 79 - 97 fL   MCH 31.0 26.6 - 33.0 pg   MCHC 33.7 31.5 - 35.7 g/dL   RDW 40.3 47.4 - 25.9 %   Platelets 360 150 - 450 x10E3/uL   Neutrophils 46 Not Estab. %   Lymphs 43 Not Estab. %   Monocytes 7  Not Estab. %   Eos 3 Not Estab. %   Basos 1 Not Estab. %   Neutrophils Absolute 2.9 1.4 - 7.0 x10E3/uL   Lymphocytes Absolute 2.7 0.7 - 3.1 x10E3/uL   Monocytes Absolute 0.5 0.1 - 0.9 x10E3/uL   EOS (ABSOLUTE) 0.2 0.0 - 0.4 x10E3/uL   Basophils Absolute 0.1 0.0 - 0.2 x10E3/uL   Immature Granulocytes 0 Not Estab. %   Immature Grans (Abs) 0.0 0.0 - 0.1 x10E3/uL  Lipid panel   Collection Time: 06/28/23 10:38 AM  Result Value Ref Range   Cholesterol, Total 253 (H) 100 - 199 mg/dL   Triglycerides 161 0 - 149 mg/dL   HDL 54 >09 mg/dL   VLDL Cholesterol Cal 20 5 - 40 mg/dL   LDL Chol Calc (NIH) 604 (H) 0 - 99 mg/dL   Chol/HDL Ratio 4.7 (H) 0.0 - 4.4 ratio       Assessment & Plan:   Problem List Items Addressed This Visit     Anxiety and depression   Stable on fluoxetine with ambien for sleep. No changes.       Tobacco use disorder, continuous   Continues to smoke approximately one pack per day. Acknowledges smoking may contribute to health issues but has not attempted to quit due to recurrence of autoimmune flairs with cessation. - Discuss smoking cessation options at future visits      Psoriatic arthritis (HCC)   Experiencing a flare-up of arthritis symptoms, exacerbated by recent influenza A infection. Discontinued methotrexate due to severe GI side effects. Considering restarting sulfasalazine in capsule form and has not yet started hydroxychloroquine. Significant inflammation and pain, particularly in her back, with concerns about potential bone damage. Discussed risks of biologic treatments and the need to manage symptoms effectively. Prefers imaging to assess bone damage and is open to biologic treatments if necessary. - Discuss imaging options with rheumatologist  to assess bone damage - Consider starting hydroxychloroquine - Discuss potential for biologic treatments with rheumatologist - Evaluate compounding sulfasalazine to reduce GI side effects - Continue ibuprofen for pain management      Peri-menopause   Menopause symptoms have improved since starting Veozah, initially suspected to cause GI issues. Has not yet completed blood tests from the last appointment. Symptoms have subsided, providing relief and improved sleep. - Draw blood for estradiol, FSH, LH, testosterone, thyroid, and progesterone levels - Continue Veozah as tolerated      Other insomnia   Trial of PRN ambien for management. Avoid daily use.       Hot flashes due to menopause   Well controlled with Veozah. Continue treatment.       Vaginal atrophy   Cessation of estradiol at this time. Consider use of Aquaphor topically to the perineal and vaginal areas to aid with moisture.       Screening for osteoporosis   Yeimi Debnam screening recommended due to prolonged use of oral steroids and smoking history. Considered high risk for premature bone loss.       Relevant Orders   DG Bone Density   Encounter for annual physical exam - Primary   CPE completed today. Review of HM activities and recommendations discussed and provided on AVS. Anticipatory guidance, diet, and exercise recommendations provided. Medications, allergies, and hx reviewed and updated as necessary. Due for routine screenings. Prefers Cologuard for colon cancer screening due to no family history of colon cancer. Discussed importance of bone density assessment due to family history and steroid use. Orders placed as listed below.  Plan: - Labs ordered. Will make changes as necessary based on results.  - I will review these results and send recommendations via MyChart or a telephone call.  - F/U with CPE in 1 year or sooner for acute/chronic health needs as directed.        Relevant Orders   CBC with  Differential/Platelet (Completed)   Lipid panel (Completed)   Screening for colon cancer   Relevant Orders   Cologuard   Other Visit Diagnoses       Other mixed anxiety disorders             Follow up plan: Return in about 1 year (around 06/27/2024) for CPE.  NEXT PREVENTATIVE PHYSICAL DUE IN 1 YEAR.  PATIENT COUNSELING PROVIDED FOR ALL ADULT PATIENTS: A well balanced diet low in saturated fats, cholesterol, and moderation in carbohydrates.  This can be as simple as monitoring portion sizes and cutting back on sugary beverages such as soda and juice to start with.    Daily water consumption of at least 64 ounces.  Physical activity at least 180 minutes per week.  If just starting out, start 10 minutes a day and work your way up.   This can be as simple as taking the stairs instead of the elevator and walking 2-3 laps around the office  purposefully every day.   STD protection, partner selection, and regular testing if high risk.  Limited consumption of alcoholic beverages if alcohol is consumed. For men, I recommend no more than 14 alcoholic beverages per week, spread out throughout the week (max 2 per day). Avoid "binge" drinking or consuming large quantities of alcohol in one setting.  Please let me know if you feel you may need help with reduction or quitting alcohol consumption.   Avoidance of nicotine, if used. Please let me know if you feel you may need help with reduction or quitting nicotine use.   Daily mental health attention. This can be in the form of 5 minute daily meditation, prayer, journaling, yoga, reflection, etc.  Purposeful attention to your emotions and mental state can significantly improve your overall wellbeing  and  Health.  Please know that I am here to help you with all of your health care goals and am happy to work with you to find a solution that works best for you.  The greatest advice I have received with any changes in life are to take it one  step at a time, that even means if all you can focus on is the next 60 seconds, then do that and celebrate your victories.  With any changes in life, you will have set backs, and that is OK. The important thing to remember is, if you have a set back, it is not a failure, it is an opportunity to try again! Screening Testing Mammogram Every 1 -2 years based on history and risk factors Starting at age 52 Pap Smear Ages 21-39 every 3 years Ages 3-65 every 5 years with HPV testing More frequent testing may be required based on results and history Colon Cancer Screening Every 1-10 years based on test performed, risk factors, and history Starting at age 77 Bone Density Screening Every 2-10 years based on history Starting at age 19 for women Recommendations for men differ based on medication usage, history, and risk factors AAA Screening One time ultrasound Men 27-50 years old who have every smoked Lung Cancer Screening Low Dose Lung CT every 12 months Age 24-80 years with a  30 pack-year smoking history who still smoke or who have quit within the last 15 years   Screening Labs Routine  Labs: Complete Blood Count (CBC), Complete Metabolic Panel (CMP), Cholesterol (Lipid Panel) Every 6-12 months based on history and medications May be recommended more frequently based on current conditions or previous results Hemoglobin A1c Lab Every 3-12 months based on history and previous results Starting at age 51 or earlier with diagnosis of diabetes, high cholesterol, BMI >26, and/or risk factors Frequent monitoring for patients with diabetes to ensure blood sugar control Thyroid Panel (TSH) Every 6 months based on history, symptoms, and risk factors May be repeated more often if on medication HIV One time testing for all patients 80 and older May be repeated more frequently for patients with increased risk factors or exposure Hepatitis C One time testing for all patients 48 and older May be  repeated more frequently for patients with increased risk factors or exposure Gonorrhea, Chlamydia Every 12 months for all sexually active persons 13-24 years Additional monitoring may be recommended for those who are considered high risk or who have symptoms Every 12 months for any woman on birth control, regardless of sexual activity PSA Men 35-61 years old with risk factors Additional screening may be recommended from age 32-69 based on risk factors, symptoms, and history  Vaccine Recommendations Tetanus Booster All adults every 10 years Flu Vaccine All patients 6 months and older every year COVID Vaccine All patients 12 years and older Initial dosing with booster May recommend additional booster based on age and health history HPV Vaccine 2 doses all patients age 72-26 Dosing may be considered for patients over 26 Shingles Vaccine (Shingrix) 2 doses all adults 55 years and older Pneumonia (Pneumovax 23) All adults 65 years and older May recommend earlier dosing based on health history One year apart from Prevnar 13 Pneumonia (Prevnar 15) All adults 65 years and older Dosed 1 year after Pneumovax 23 Pneumonia (Prevnar 20) One time alternative to the two dosing of 13 and 23 For all adults with initial dose of 23, 20 is recommended 1 year later For all adults with initial dose of 13, 23 is still recommended as second option 1 year later

## 2023-06-28 ENCOUNTER — Encounter: Payer: Self-pay | Admitting: Nurse Practitioner

## 2023-06-28 ENCOUNTER — Other Ambulatory Visit (HOSPITAL_COMMUNITY): Payer: Self-pay

## 2023-06-28 ENCOUNTER — Ambulatory Visit: Payer: PRIVATE HEALTH INSURANCE | Admitting: Nurse Practitioner

## 2023-06-28 VITALS — BP 120/72 | HR 80 | Ht 63.5 in | Wt 117.4 lb

## 2023-06-28 DIAGNOSIS — F413 Other mixed anxiety disorders: Secondary | ICD-10-CM | POA: Diagnosis not present

## 2023-06-28 DIAGNOSIS — Z0000 Encounter for general adult medical examination without abnormal findings: Secondary | ICD-10-CM | POA: Diagnosis not present

## 2023-06-28 DIAGNOSIS — F32A Depression, unspecified: Secondary | ICD-10-CM

## 2023-06-28 DIAGNOSIS — N952 Postmenopausal atrophic vaginitis: Secondary | ICD-10-CM

## 2023-06-28 DIAGNOSIS — L4050 Arthropathic psoriasis, unspecified: Secondary | ICD-10-CM

## 2023-06-28 DIAGNOSIS — N951 Menopausal and female climacteric states: Secondary | ICD-10-CM

## 2023-06-28 DIAGNOSIS — F419 Anxiety disorder, unspecified: Secondary | ICD-10-CM

## 2023-06-28 DIAGNOSIS — G4709 Other insomnia: Secondary | ICD-10-CM

## 2023-06-28 DIAGNOSIS — Z13820 Encounter for screening for osteoporosis: Secondary | ICD-10-CM | POA: Insufficient documentation

## 2023-06-28 DIAGNOSIS — Z1211 Encounter for screening for malignant neoplasm of colon: Secondary | ICD-10-CM

## 2023-06-28 DIAGNOSIS — F17209 Nicotine dependence, unspecified, with unspecified nicotine-induced disorders: Secondary | ICD-10-CM

## 2023-06-28 LAB — LIPID PANEL

## 2023-06-28 NOTE — Telephone Encounter (Signed)
 I have called and spoke with the pts plan and was informed that this RX is excluded from the pts formulary. However the pt was still able to pickup her rx on 06/05/23 with a ''[employer] Coupon Card'' at a cost of 0.00 via her pref'd pharmacy .

## 2023-06-28 NOTE — Patient Instructions (Addendum)
 Send me the warning from the estrogen cream.  I have sent the order for the cologuard and dexa (bone density scan). They will call to schedule this.

## 2023-06-29 LAB — CBC WITH DIFFERENTIAL/PLATELET
Basophils Absolute: 0.1 10*3/uL (ref 0.0–0.2)
Basos: 1 %
EOS (ABSOLUTE): 0.2 10*3/uL (ref 0.0–0.4)
Eos: 3 %
Hematocrit: 37.7 % (ref 34.0–46.6)
Hemoglobin: 12.7 g/dL (ref 11.1–15.9)
Immature Grans (Abs): 0 10*3/uL (ref 0.0–0.1)
Immature Granulocytes: 0 %
Lymphocytes Absolute: 2.7 10*3/uL (ref 0.7–3.1)
Lymphs: 43 %
MCH: 31 pg (ref 26.6–33.0)
MCHC: 33.7 g/dL (ref 31.5–35.7)
MCV: 92 fL (ref 79–97)
Monocytes Absolute: 0.5 10*3/uL (ref 0.1–0.9)
Monocytes: 7 %
Neutrophils Absolute: 2.9 10*3/uL (ref 1.4–7.0)
Neutrophils: 46 %
Platelets: 360 10*3/uL (ref 150–450)
RBC: 4.1 x10E6/uL (ref 3.77–5.28)
RDW: 12.2 % (ref 11.7–15.4)
WBC: 6.3 10*3/uL (ref 3.4–10.8)

## 2023-06-29 LAB — LIPID PANEL
Cholesterol, Total: 253 mg/dL — ABNORMAL HIGH (ref 100–199)
HDL: 54 mg/dL (ref >39)
LDL CALC COMMENT:: 4.7 ratio — ABNORMAL HIGH (ref 0.0–4.4)
LDL Chol Calc (NIH): 179 mg/dL — ABNORMAL HIGH (ref 0–99)
Triglycerides: 112 mg/dL (ref 0–149)
VLDL Cholesterol Cal: 20 mg/dL (ref 5–40)

## 2023-07-02 ENCOUNTER — Ambulatory Visit: Payer: PRIVATE HEALTH INSURANCE | Admitting: Internal Medicine

## 2023-07-02 DIAGNOSIS — L4050 Arthropathic psoriasis, unspecified: Secondary | ICD-10-CM

## 2023-07-02 DIAGNOSIS — L404 Guttate psoriasis: Secondary | ICD-10-CM

## 2023-07-02 DIAGNOSIS — Z79899 Other long term (current) drug therapy: Secondary | ICD-10-CM

## 2023-07-02 DIAGNOSIS — M720 Palmar fascial fibromatosis [Dupuytren]: Secondary | ICD-10-CM

## 2023-07-03 LAB — COMPREHENSIVE METABOLIC PANEL
ALT: 13 IU/L (ref 0–32)
AST: 17 IU/L (ref 0–40)
Albumin: 4.5 g/dL (ref 3.9–4.9)
Alkaline Phosphatase: 93 IU/L (ref 44–121)
BUN/Creatinine Ratio: 13 (ref 9–23)
BUN: 9 mg/dL (ref 6–24)
Bilirubin Total: 0.3 mg/dL (ref 0.0–1.2)
CO2: 22 mmol/L (ref 20–29)
Calcium: 9.5 mg/dL (ref 8.7–10.2)
Chloride: 103 mmol/L (ref 96–106)
Creatinine, Ser: 0.67 mg/dL (ref 0.57–1.00)
Globulin, Total: 2 g/dL (ref 1.5–4.5)
Glucose: 98 mg/dL (ref 70–99)
Potassium: 4.5 mmol/L (ref 3.5–5.2)
Sodium: 139 mmol/L (ref 134–144)
Total Protein: 6.5 g/dL (ref 6.0–8.5)
eGFR: 108 mL/min/{1.73_m2} (ref >59)

## 2023-07-03 LAB — TESTOSTERONE, FREE, TOTAL, SHBG
Sex Hormone Binding: 82.9 nmol/L (ref 24.6–122.0)
Testosterone, Free: <0.2 pg/mL (ref 0.0–4.2)
Testosterone: <3 ng/dL — ABNORMAL LOW (ref 4–50)

## 2023-07-03 LAB — TSH: TSH: 1.64 u[IU]/mL (ref 0.450–4.500)

## 2023-07-03 LAB — FSH/LH
FSH: 122 m[IU]/mL
LH: 53.4 m[IU]/mL

## 2023-07-03 LAB — T4, FREE: Free T4: 1.09 ng/dL (ref 0.82–1.77)

## 2023-07-03 LAB — ESTRADIOL: Estradiol: <5 pg/mL

## 2023-07-03 LAB — PROGESTERONE: Progesterone: <0.1 ng/mL

## 2023-07-10 DIAGNOSIS — Z0000 Encounter for general adult medical examination without abnormal findings: Secondary | ICD-10-CM | POA: Insufficient documentation

## 2023-07-10 NOTE — Assessment & Plan Note (Signed)
 Experiencing a flare-up of arthritis symptoms, exacerbated by recent influenza A infection. Discontinued methotrexate due to severe GI side effects. Considering restarting sulfasalazine in capsule form and has not yet started hydroxychloroquine. Significant inflammation and pain, particularly in her back, with concerns about potential bone damage. Discussed risks of biologic treatments and the need to manage symptoms effectively. Prefers imaging to assess bone damage and is open to biologic treatments if necessary. - Discuss imaging options with rheumatologist to assess bone damage - Consider starting hydroxychloroquine - Discuss potential for biologic treatments with rheumatologist - Evaluate compounding sulfasalazine to reduce GI side effects - Continue ibuprofen for pain management

## 2023-07-10 NOTE — Assessment & Plan Note (Signed)
 [place] screening recommended due to prolonged use of oral steroids and smoking history. Considered high risk for premature bone loss.

## 2023-07-10 NOTE — Assessment & Plan Note (Signed)
 Well controlled with Veozah. Continue treatment.

## 2023-07-10 NOTE — Assessment & Plan Note (Signed)
 CPE completed today. Review of HM activities and recommendations discussed and provided on AVS. Anticipatory guidance, diet, and exercise recommendations provided. Medications, allergies, and hx reviewed and updated as necessary. Due for routine screenings. Prefers Cologuard for colon cancer screening due to no family history of colon cancer. Discussed importance of bone density assessment due to family history and steroid use. Orders placed as listed below.  Plan: - Labs ordered. Will make changes as necessary based on results.  - I will review these results and send recommendations via MyChart or a telephone call.  - F/U with CPE in 1 year or sooner for acute/chronic health needs as directed.

## 2023-07-10 NOTE — Assessment & Plan Note (Signed)
 Cessation of estradiol at this time. Consider use of Aquaphor topically to the perineal and vaginal areas to aid with moisture.

## 2023-07-10 NOTE — Assessment & Plan Note (Signed)
 Stable on fluoxetine with ambien for sleep. No changes.

## 2023-07-10 NOTE — Assessment & Plan Note (Signed)
 Menopause symptoms have improved since starting Veozah, initially suspected to cause GI issues. Has not yet completed blood tests from the last appointment. Symptoms have subsided, providing relief and improved sleep. - Draw blood for estradiol, FSH, LH, testosterone, thyroid, and progesterone levels - Continue Veozah as tolerated

## 2023-07-10 NOTE — Assessment & Plan Note (Signed)
 Trial of PRN ambien for management. Avoid daily use.

## 2023-07-10 NOTE — Assessment & Plan Note (Signed)
 Continues to smoke approximately one pack per day. Acknowledges smoking may contribute to health issues but has not attempted to quit due to recurrence of autoimmune flairs with cessation. - Discuss smoking cessation options at future visits

## 2023-07-12 ENCOUNTER — Encounter: Payer: Self-pay | Admitting: Nurse Practitioner

## 2023-07-20 ENCOUNTER — Other Ambulatory Visit (HOSPITAL_COMMUNITY): Payer: Self-pay

## 2023-09-18 LAB — COLOGUARD: COLOGUARD: NEGATIVE

## 2024-01-30 ENCOUNTER — Other Ambulatory Visit: Payer: Self-pay | Admitting: Nurse Practitioner

## 2024-01-30 DIAGNOSIS — M130 Polyarthritis, unspecified: Secondary | ICD-10-CM

## 2024-01-30 DIAGNOSIS — F32A Depression, unspecified: Secondary | ICD-10-CM

## 2024-01-30 DIAGNOSIS — F413 Other mixed anxiety disorders: Secondary | ICD-10-CM

## 2024-01-30 DIAGNOSIS — R7689 Other specified abnormal immunological findings in serum: Secondary | ICD-10-CM

## 2024-01-30 DIAGNOSIS — Z79899 Other long term (current) drug therapy: Secondary | ICD-10-CM

## 2024-01-30 DIAGNOSIS — E063 Autoimmune thyroiditis: Secondary | ICD-10-CM

## 2024-02-19 NOTE — Progress Notes (Signed)
 Office Visit Note  Patient: Chelsea Hill             Date of Birth: [birth year]           MRN: [id number]             PCP: Oris Camie BRAVO, NP Referring: Oris Camie BRAVO, NP Visit Date: 03/04/2024   Subjective:   Discussed the use of AI scribe software for clinical note transcription with the patient, who gave verbal consent to proceed.  History of Present Illness    History of Present Illness: Chelsea Hill is a 48 y.o. 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  She switched from methotrexate  to sulfasalazine  end of last year, starting with one pill due to concerns about side effects. She had severe worsening of symptoms initially after stopping methotrexate . While one pill does not completely alleviate her symptoms, it allows her to function without being 'absolutely miserable'. She is apprehensive about adding a second pill due to past experiences of feeling very sick when increasing the dose in the past.  She experiences skin rashes approximately four times per month, which are smaller and less frequent than before. These rashes typically appear on her lower back and stomach, sometimes unnoticed until they reach a dry, psoriasis-like stage. She has not needed to use topical steroids or other medications for these rashes.  She has lumps and some swelling, particularly in her right toe, but no major swelling recently. Her knees have improved on sulfasalazine . She describes a sensation of chest pain when bending over, which feels like her heart is pounding against her rib cage. This sensation resolves about a minute after standing up and has been occurring for about nine months. No shortness of breath or difficulty lying down.  She experienced an upper respiratory illness after a flight a couple of months ago, which lasted about a week without requiring antibiotics. She does not report getting sick more frequently than usual.  She  has some leg swelling at times, but it is not as severe as it used to be.       Previous HPI 04/03/2023 Chelsea Hill is a 48 y.o. female here for follow up for psoriatic arthritis and guttate psoriasis on methotrexate  12.5 mg subcu weekly and folic acid  1 mg daily.  Presents with a new lump in the palm of their hand that developed subtly over the past couple of months.  She for started noticing it since about 5 days after her last clinic appointment.  The lump, initially painless, has started to cause discomfort when pressure is applied or when the patient attempts to pick up objects. The patient also reports a sensation of the lump spreading towards the adjacent fingers.   In addition to the lump, the patient reports dissatisfaction with their current methotrexate  treatment. They express concerns about the medication's efficacy, noting that it seems to trigger flares some weeks. The patient also experienced a severe episode of nausea and vomiting, which they suspect might be related to the methotrexate . They report worsening arthritis symptoms, particularly in the knees, feet, and hands. She needs need to take ibuprofen  600 mg as needed for breakthrough joint pain estimates this 6 times per week.   Previous HPI 01/22/2023 Chelsea Hill is a 48 y.o. 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 She started methotrexate  10 mg [place] weekly since last visit feels her  inflammation and distal finger nodules worsened initially before stabilizing but in the last week feels symptoms are partially improved. Noticed 4 days of improvement after methotrexate  injection then worsening in joints and skin itching. She is not seeing new rashes break out but gets intense itching that she associates preceding her rashes usually, and a few spots appear after scratching.   Previous HPI 12/11/2022 Chelsea Hill is a 48 y.o. 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  Currently tolerating taking 2 tablets at night only.  She reports a definite change in symptoms while on sulfasalazine  partial improvement in joint pain and stiffness and her rashes are well-controlled.  When stopping the medicine entirely for 4 days or longer started to notice rash coming back in multiple areas including very itchy inflammation on the scalp.  She had to mild viral upper respiratory infections since our last visit no complications.   Previous HPI 10/09/2022 Chelsea Hill is a 48 y.o. 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 She has seen good improvement in skin rash and pleuritic chest pain symptoms are currently resolved. Joint pains including hips which were worst are about 75% better right now. She notices nausea every day while taking 1000 mg BID dose, but was tolerating 1 tablet BID without issue. Sometimes skips a dose when having worse upset. No other side effect noted. Still fatigued, maybe 25% better not as much as her joint issues.   Previous HPI 08/14/22 Chelsea Hill is a 48 y.o. female here for follow up for her joint pain and rashes in multiple areas currently feeling very poorly after tapering down the prednisone  since our last visit.  Lab testing at that visit redemonstrated a positive ANA but was otherwise completely unremarkable including acute inflammatory markers and immunoglobulins.  Histamine  release assay was also unremarkable.   Previous HPI 07/04/22 Chelsea Hill is a 48 y.o. female here for evaluation of positive ANA and multiple chronic symptoms including joint pain, fatigue, and rashes, and other problems with episodic flare ups.  She noticed  problems new or severely increased when she quit smoking about 5 years ago.  She developed facial rashes and joint pain and swelling in multiple areas especially with bilateral knee swelling pain and decreased motion. Lived in [place]  at the time had a evaluation at a local office then [facility].  She saw orthopedic surgery clinic with symptoms attributed to osteoarthritis.  Subsequently evaluated with rheumatology due to inflammatory changes and having recurrent tendinitis or enthesitis episodes.  X-ray and MRI imaging studies were apparently mostly unremarkable besides some degenerative changes.  Subsequently started breaking out in skin rashes throughout her torso with numerous isolated erythematous papules the subsequently involuted or flattened leaving behind small erythematous patches that resolved over a course of multiple weeks.  She had biopsy of the skin rash reports this was apparently nonspecific with some type of pathology or immunohistochemical staining suggestive for an autoimmune process but not definitive.  She also saw some scalp rash and generalized hair thinning evaluation for this also including biopsy reports this demonstrated telogen effluvium but was otherwise nonspecific.  Also found to have Hashimoto's thyroiditis and started on low-dose levothyroxine  supplementation.  She did not start any  other disease specific long-term medications though intermittent use of steroids improve her symptoms dramatically.  She resumed cigarette smoking this resolved her symptoms pretty complet ely for about 9 months until they started to occur at a milder severity than at the onset. She moved to [place]  about 2-1/2 years ago noticed new symptom with more severe erythematous facial rash including swelling over the nose and over upper eyelids even affecting her vision.  Lab testing has been mostly unremarkable with a repeat checked late last year showed a positive ANA with negative  reflex antibody panel.  Symptoms continue to improve while on steroids but flareup again off of treatment.  Also found partial benefit with skin rashes adding oral antihistamine to low-dose prednisone .  Due to review found of symptoms every time she stopped she is now on prednisone  10 mg daily for the past 4 months Symptoms are fairly well-controlled today.  Most significant joint problem right now is that affecting her hips with tendinitis pain or greater trochanteric pain.  This has been an ongoing problem with previous involvement at epicondylitis on both elbows and Achilles tendinitis in both ankles.     Labs reviewed 01/2022 ANA 1:80 RF neg ESR wnl CRP wnl   10/2020 ANA neg   Review of Systems  Constitutional:  Positive for fatigue.  HENT:  Negative for mouth sores and mouth dryness.   Eyes:  Positive for dryness.  Respiratory:  Negative for shortness of breath.   Cardiovascular:  Positive for chest pain. Negative for palpitations.  Gastrointestinal:  Negative for blood in stool, constipation and diarrhea.  Endocrine: Negative for increased urination.  Genitourinary:  Negative for involuntary urination.  Musculoskeletal:  Positive for joint pain, gait problem, joint pain, joint swelling, muscle weakness, morning stiffness and muscle tenderness. Negative for myalgias and myalgias.  Skin:  Positive for rash. Negative for color change, hair loss and sensitivity to sunlight.  Allergic/Immunologic: Negative for susceptible to infections.  Neurological:  Negative for dizziness and headaches.  Hematological:  Negative for swollen glands.  Psychiatric/Behavioral:  Positive for depressed mood and sleep disturbance. The patient is nervous/anxious.     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   Guttate psoriasis 12/30/2022   Positive ANA (antinuclear antibody) 02/27/2022   Family history of premature CAD 02/27/2022   Acute bilateral low back pain with bilateral sciatica 06/07/2021   Greater trochanteric pain syndrome of left lower extremity 05/25/2021   Coccyx pain 05/25/2021   Tobacco use disorder, continuous 11/25/2020   Hypothyroidism due to Hashimoto's thyroiditis 10/07/2020   Anxiety and depression 10/07/2020   Psoriatic arthritis (HCC) 10/07/2020    Past Medical History:  Diagnosis Date   Alcohol addiction (HCC)    Anxiety    COVID-19 01/11/2021   Depression    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HCC)    Hashimoto's disease    Hyperlipidemia    Long term current use of systemic steroids 08/14/2022   Orofacial granulomatosis    Thyroid  disease     Family History  Problem Relation Age of Onset   Lung cancer Mother    Alcoholism Mother    Hypertension Father    Heart disease Father  Hyperlipidemia Brother    Breast cancer Maternal Aunt    Past Surgical History:  Procedure Laterality Date   ABDOMINAL HYSTERECTOMY     BILATERAL CARPAL TUNNEL RELEASE Bilateral    SPINE SURGERY     Social History   Social History Narrative   Not on file   Immunization History  Administered Date(s) Administered   Influenza-Unspecified 03/30/2022, 03/02/2023   PFIZER(Purple Top)SARS-COV-2 Vaccination 12/17/2019, 01/07/2020     Objective: Vital Signs: BP 94/60   Pulse 76   Temp 97.8 F (36.6 C)   Resp 16   Ht 5' 3.5 (1.613 m)   Wt 134 lb 6.4 oz (61 kg)   BMI 23.43 kg/m    Physical Exam Eyes:     Conjunctiva/sclera: Conjunctivae normal.  Cardiovascular:     Rate and Rhythm: Normal rate and regular rhythm.  Pulmonary:     Effort: Pulmonary effort is normal.     Breath sounds: Normal breath sounds.  Lymphadenopathy:     Cervical:  No cervical adenopathy.  Skin:    General: Skin is warm and dry.     Findings: Rash present.     Comments: Small patches on left lower back, mildly erythema with some dryness no overlying plaque  Neurological:     Mental Status: She is alert.  Psychiatric:        Mood and Affect: Mood normal.      Musculoskeletal Exam:  Elbows full ROM no tenderness or swelling Wrists full ROM no tenderness or swelling Right hand palmar nontender, fixed nodule on palm proximal to 3rd MCP joint Fingers full ROM no tenderness or swelling Knees full ROM, tenderness, no palpable effusion Ankles full ROM no tenderness or swelling  Investigation: No additional findings.  Imaging: No results found.  Recent Labs: Lab Results  Component Value Date   WBC 6.3 06/28/2023   HGB 12.7 06/28/2023   PLT 360 06/28/2023   NA 139 06/28/2023   K 4.5 06/28/2023   CL 103 06/28/2023   CO2 22 06/28/2023   GLUCOSE 98 06/28/2023   BUN 9 06/28/2023   CREATININE 0.67 06/28/2023   BILITOT 0.3 06/28/2023   ALKPHOS 93 06/28/2023   AST 17 06/28/2023   ALT 13 06/28/2023   PROT 6.5 06/28/2023   ALBUMIN 4.5 06/28/2023   CALCIUM 9.5 06/28/2023   QFTBGOLDPLUS NEGATIVE 08/14/2022    Speciality Comments: No specialty comments available.  Procedures:  No procedures performed Allergies: Venlafaxine   Assessment / Plan:     Visit Diagnoses: Psoriatic arthritis (HCC) - Plan: Sedimentation rate, sulfaSALAzine  (AZULFIDINE ) 500 MG EC tablet Well-managed with sulfasalazine . Experiences minor self-resolving rashes. No major swelling or significant skin flares. Hesitant to increase sulfasalazine  due to past adverse reactions. Reports unique chest pain when bending over, resolves upon standing. Occasional leg swelling, less severe than before. - Continue sulfasalazine , consider adding a second pill if tolerated. - Ordered updated labs to monitor condition. - Monitor for adverse reactions to increased sulfasalazine   dosage.  High risk medication use - Plan: CBC with Differential/Platelet, Comprehensive metabolic panel with GFR Tolerating once daily sulfasalazine  so well without severe GI intolerance also not having serious interval infections.  Previous labs from earlier this year were normal on treatment but due for repeat. -Checking CBC and CMP for medication monitoring on continued daily sulfasalazine   Palmar fascial fibromatosis Well-managed with no significant changes in nodules.  - Continue monitoring for changes in nodules or symptoms.   Guttate psoriasis Small patches on low back and she  reports getting these on the abdomen as well, currently without significant overlying scale or plaque  Orders: Orders Placed This Encounter  Procedures   Sedimentation rate   CBC with Differential/Platelet   Comprehensive metabolic panel with GFR   Meds ordered this encounter  Medications   sulfaSALAzine  (AZULFIDINE ) 500 MG EC tablet    Sig: Take 1 tablet (500 mg total) by mouth 2 (two) times daily.    Dispense:  180 tablet    Refill:  1     Follow-Up Instructions: Return in about 6 months (around 09/01/2024) for PsA on SSZ f/u 6mos.   Lonni LELON Ester, MD  Note - This record has been created using [employer].  Chart creation errors have been sought, but may not always  have been located. Such creation errors do not reflect on  the standard of medical care.

## 2024-02-22 ENCOUNTER — Other Ambulatory Visit: Payer: PRIVATE HEALTH INSURANCE

## 2024-03-04 ENCOUNTER — Encounter: Payer: Self-pay | Admitting: Internal Medicine

## 2024-03-04 ENCOUNTER — Ambulatory Visit: Payer: PRIVATE HEALTH INSURANCE | Attending: Internal Medicine | Admitting: Internal Medicine

## 2024-03-04 VITALS — BP 94/60 | HR 76 | Temp 97.8°F | Resp 16 | Ht 63.5 in | Wt 134.4 lb

## 2024-03-04 DIAGNOSIS — M720 Palmar fascial fibromatosis [Dupuytren]: Secondary | ICD-10-CM | POA: Diagnosis not present

## 2024-03-04 DIAGNOSIS — Z79899 Other long term (current) drug therapy: Secondary | ICD-10-CM | POA: Diagnosis not present

## 2024-03-04 DIAGNOSIS — L404 Guttate psoriasis: Secondary | ICD-10-CM | POA: Diagnosis not present

## 2024-03-04 DIAGNOSIS — N951 Menopausal and female climacteric states: Secondary | ICD-10-CM

## 2024-03-04 DIAGNOSIS — L4050 Arthropathic psoriasis, unspecified: Secondary | ICD-10-CM

## 2024-03-04 MED ORDER — SULFASALAZINE 500 MG PO TBEC
500.0000 mg | DELAYED_RELEASE_TABLET | Freq: Two times a day (BID) | ORAL | 1 refills | Status: AC
Start: 2024-03-04 — End: ?

## 2024-03-05 ENCOUNTER — Ambulatory Visit: Payer: Self-pay | Admitting: Internal Medicine

## 2024-03-05 LAB — CBC WITH DIFFERENTIAL/PLATELET
Absolute Lymphocytes: 3587 {cells}/uL (ref 850–3900)
Absolute Monocytes: 756 {cells}/uL (ref 200–950)
Basophils Absolute: 42 {cells}/uL (ref 0–200)
Basophils Relative: 0.5 %
Eosinophils Absolute: 185 {cells}/uL (ref 15–500)
Eosinophils Relative: 2.2 %
HCT: 42.3 % (ref 35.0–45.0)
Hemoglobin: 14.1 g/dL (ref 11.7–15.5)
MCH: 31.1 pg (ref 27.0–33.0)
MCHC: 33.3 g/dL (ref 32.0–36.0)
MCV: 93.4 fL (ref 80.0–100.0)
MPV: 9.2 fL (ref 7.5–12.5)
Monocytes Relative: 9 %
Neutro Abs: 3830 {cells}/uL (ref 1500–7800)
Neutrophils Relative %: 45.6 %
Platelets: 349 Thousand/uL (ref 140–400)
RBC: 4.53 Million/uL (ref 3.80–5.10)
RDW: 12.7 % (ref 11.0–15.0)
Total Lymphocyte: 42.7 %
WBC: 8.4 Thousand/uL (ref 3.8–10.8)

## 2024-03-05 LAB — COMPREHENSIVE METABOLIC PANEL WITH GFR
AG Ratio: 2.2 (calc) (ref 1.0–2.5)
ALT: 11 U/L (ref 6–29)
AST: 14 U/L (ref 10–35)
Albumin: 4.8 g/dL (ref 3.6–5.1)
Alkaline phosphatase (APISO): 68 U/L (ref 31–125)
BUN: 13 mg/dL (ref 7–25)
CO2: 29 mmol/L (ref 20–32)
Calcium: 10 mg/dL (ref 8.6–10.2)
Chloride: 101 mmol/L (ref 98–110)
Creat: 0.76 mg/dL (ref 0.50–0.99)
Globulin: 2.2 g/dL (ref 1.9–3.7)
Glucose, Bld: 96 mg/dL (ref 65–99)
Potassium: 4.4 mmol/L (ref 3.5–5.3)
Sodium: 138 mmol/L (ref 135–146)
Total Bilirubin: 0.4 mg/dL (ref 0.2–1.2)
Total Protein: 7 g/dL (ref 6.1–8.1)
eGFR: 96 mL/min/1.73m2 (ref >=60)

## 2024-03-05 LAB — SEDIMENTATION RATE: Sed Rate: 9 mm/h (ref 0–20)

## 2024-06-30 ENCOUNTER — Encounter: Payer: PRIVATE HEALTH INSURANCE | Admitting: Nurse Practitioner

## 2024-09-01 ENCOUNTER — Ambulatory Visit: Payer: PRIVATE HEALTH INSURANCE | Admitting: Internal Medicine
